# Patient Record
Sex: Male | Born: 1955 | Race: White | Hispanic: No | Marital: Married | Smoking: Never smoker
Health system: Southern US, Community
[De-identification: ages and names within clinical notes are randomized; demographics above are authoritative.]

## PROBLEM LIST (undated history)

## (undated) DIAGNOSIS — Z87898 Personal history of other specified conditions: Secondary | ICD-10-CM

## (undated) DIAGNOSIS — J45909 Unspecified asthma, uncomplicated: Secondary | ICD-10-CM

## (undated) DIAGNOSIS — E669 Obesity, unspecified: Secondary | ICD-10-CM

## (undated) DIAGNOSIS — Z91018 Allergy to other foods: Secondary | ICD-10-CM

## (undated) HISTORY — DX: Obesity, unspecified: E66.9

## (undated) HISTORY — PX: KNEE ARTHROSCOPY: SUR90

## (undated) HISTORY — PX: KNEE SURGERY: SHX244

## (undated) HISTORY — DX: Unspecified asthma, uncomplicated: J45.909

## (undated) HISTORY — DX: Allergy to other foods: Z91.018

## (undated) HISTORY — PX: TONSILLECTOMY: SUR1361

## (undated) HISTORY — DX: Personal history of other specified conditions: Z87.898

---

## 2002-08-19 DIAGNOSIS — H7090 Unspecified mastoiditis, unspecified ear: Secondary | ICD-10-CM

## 2002-08-19 HISTORY — DX: Unspecified mastoiditis, unspecified ear: H70.90

## 2003-10-17 ENCOUNTER — Inpatient Hospital Stay (HOSPITAL_COMMUNITY): Admission: RE | Admit: 2003-10-17 | Discharge: 2003-10-20 | Payer: Self-pay | Admitting: Otolaryngology

## 2009-11-02 ENCOUNTER — Encounter: Admission: RE | Admit: 2009-11-02 | Discharge: 2009-11-02 | Payer: Self-pay | Admitting: Family Medicine

## 2011-01-04 NOTE — Discharge Summary (Signed)
NAME:  Victor Best, Victor Best                           ACCOUNT NO.:  [id number]   MEDICAL RECORD NO.:  [id number]                   PATIENT TYPE:  INP   LOCATION:  5733                                 FACILITY:  [facility]   PHYSICIAN:  Kinnie Scales. Annalee Genta, M.D.            DATE OF BIRTH:  February 19, 1956   DATE OF ADMISSION:  DATE OF DISCHARGE:  10/20/2003                                 DISCHARGE SUMMARY   SERVICE:  Ear, nose and throat.   ADMISSION DIAGNOSIS:  Acute right mastoiditis with facial nerve dysfunction.   DISCHARGE DIAGNOSIS:  Acute right mastoiditis with facial nerve dysfunction.   SURGICAL PROCEDURES:  Right myringotomy and tube placement, October 17, 2003.   DISCHARGE INSTRUCTIONS:  The patient is discharged to home in stable  condition in the company of his wife.   DISCHARGE MEDICATIONS:  1. Continue to use previously prescribed Levaquin 500 mg p.o. daily.  2. Topical ear drops, ____ four drops in the right ear three times a day.   ADDITIONAL DISCHARGE MEDICATIONS:  1. Medrol Dosepak to be taken as directed.  2. Lacri-Lube ointment applied to the right eye at night.  3. Pain management as previously prescribed.   DISCHARGE ACTIVITIES:  No limitations.   DISCHARGE DIET:  No limitations.   SPECIAL INSTRUCTIONS:  1. The patient is to protect the right eye with glasses during the day,     avoiding trauma.  2. He will use Lacri-Lube ointment at night and tape to close the right     eyelid.  3. He is to keep the right ear dry while showering.  4. He will call for a followup appointment next week in my office at [phone] or sooner if needed.   BRIEF HISTORY:  The patient is a 55 year old white male who was seen on an  emergency basis on October 17, 2003, with a three-week history of acute  respiratory tract infection, one week history of severe otalgia and right-  sided otorrhea.  On the morning of October 16, 2003, the patient awakened  to find right facial paralysis  and contacted our office the morning of  October 17, 2003, and was evaluated on an emergency basis.  He was found to  have a middle-ear effusion, tenderness over the mastoid, a low-grade fever,  and grade 5 right facial paresis.  Given these findings, he was admitted to  [facility] on an emergency basis.  The CT scan of the temporal bone  was obtained, and he was scheduled for evaluation in the operating room with  a right myringotomy and tube placement.   The patient was admitted to the ENT service on October 17, 2003, under Dr.  Thurmon Fair care.  A CT scan was obtained at [facility] which  showed partial opacification of the right mastoid with middle ear effusion.  There was no evidence of bone erosions,  coalescence or chronic mastoiditis.  No evidence of bony dehiscence or other abnormality was noted.  The patient  was admitted and started on intravenous Zosyn 3.375 grams IV q.6h.  After  his first dose of Zosyn, the patient was also started on Decadron 10 mg IV  every eight hours.  These medications were continue throughout the patient's  hospitalization.  On the evening of October 17, 2003, the patient was taken  to the operating room at [facility] for general anesthesia and  underwent right myringotomy and tube placement.  Culture and sensitivity  from the middle ear effusion was then obtained, and an  Armstrong tympanostomy tube was placed in the tympanic membrane and ____  drops were instilled.  Culture was negative at two days with no evidence of  bacterial growth.  The patient was taken from the operating room to recovery  team [id number] for postoperative monitoring.  He continued on his intravenous  antibiotic therapy and steroids.  A period of observation was undertaken.   On the morning of October 18, 2003, the patient continued to have mild  discomfort, otorrhea and continued facial nerve dysfunction.  He was then  scheduled for possible mastoidectomy  under general anesthesia on October 19, 2003.  On the morning of October 19, 2003, the patient had some improvement in  his facial nerve paralysis.  It was a grade 3 paresis at that time with  partial lid closure and forehead movement which was significant improvement  from the previous examination. He continued to have otorrhea, but the fever  and otalgia had resolved.  Given these findings,  I felt comfortable  continuing observational and medical management, and the surgical procedure  was cancelled.   The following morning, he was reevaluated.  He continued to have resolution  of pain and had minimal otorrhea on the above medications.  Facial nerve  dysfunction had not significantly improved at that point, but the patient  was clinically improved, and he was discharged to home in stable condition  in the company of his wife, with the above discharge instructions.   The patient is to contact my office with worsening symptoms, and will follow  up in one week for reevaluation.  We expect a gradual improvement in facial  nerve dysfunction over time, but we are not able to guarantee complete  recovery. The patient and his wife are comfortable with their hospital care  and discharge instructions, and will follow up with me as above, or sooner  if warranted.                                                Kinnie Scales. Annalee Genta, M.D.    DLS/MEDQ  D:  16/05/9603  T:  10/21/2003  Job:  [id number]   cc:   [facility]  [place], [place]

## 2011-01-04 NOTE — Op Note (Signed)
NAME:  Victor Best, Victor Best NO.:  [id number]   MEDICAL RECORD NO.:  [id number]                   PATIENT TYPE:  OIB   LOCATION:  2873                                 FACILITY:  [facility]   PHYSICIAN:  Kinnie Scales. Annalee Genta, M.D.            DATE OF BIRTH:  [birth year]   DATE OF PROCEDURE:  10/17/2003  DATE OF DISCHARGE:                                 OPERATIVE REPORT   PREOPERATIVE DIAGNOSIS:  1. Right acute mastoiditis with middle ear effusion.  2. Acute right facial nerve paralysis.   POSTOPERATIVE DIAGNOSIS:  1. Right acute mastoiditis with middle ear effusion.  2. Acute right facial nerve paralysis.   OPERATION PERFORMED:  Right myringotomy and tube placement.   SURGEON:  Kinnie Scales. Annalee Genta, M.D.   ANESTHESIA:  General endotracheal.   COMPLICATIONS:  None.   FINDINGS:  Clear middle ear effusion, culture and sensitivity sent.   INDICATIONS FOR PROCEDURE:  Victor Best is a 55 year old white male who  presents today in my office for evaluation of acute right visual paresis and  a one week history of right-sided otalgia and otorrhea.  The patient had no  prior otologic history, no prior ear infections or prior otologic surgery.  He and his wife reported a two to three week history of acute upper  respiratory symptoms, nasal congestion and low grade sinus complaints.  Over  the last week, he has had progressive right-sided otalgia with right-sided  clear otorrhea.  He was seen by his primary care physician and was placed on  Zithromax one week ago.  Follow-up on Friday showed continued symptoms of  discharge and complaints of pain and he was placed on pain medications,  Levaquin which he failed to start, and Ciprodex drops.  The patient reported  heavy otorrhea and continued severe right-sided otalgia.  On the morning of  October 16, 2003 he awakened with right-sided facial paresis and presents  in our office for emergency evaluation.  Examination revealed  intact  tympanic membrane with right-sided middle ear effusion. He had a mixed  hearing loss in the right ear and a dense facial paralysis.  Given the  patient's history, examination and findings, he was admitted to [facility] for emergency intravenous antibiotic therapy, CT scan of the  temporal bones and mastoid and right myringotomy and tube placement.  The  risks, benefits and possible complications of the surgical procedure were  discussed in detail with the patient's wife and they understood and  concurred with our plan for surgery which was scheduled on an emergent basis  on October 17, 2003.   DESCRIPTION OF PROCEDURE:  The patient was brought to the operating room on  October 17, 2003 and placed in supine position on the operating table.  General endotracheal anesthesia was established without difficulty.  When  the patient was adequately anesthetized, his ear was examined.  A small  amount of  ceruminous debris was removed.  The ear canal was intact and the  middle ear space appeared to be filled with fluid with moderate erythema of  the TM and a small amount of granulation tissue in the posterior tympanic  membrane.  No active otorrhea was noted.  An inferior quadrant myringotomy  was performed and a moderate amount of a clear effusion was aspirated from  the middle ear space.  A Sheehy tympanostomy tube was placed without  difficulty and Ciprodex drops were instilled within the ear canal.  The  patient was then awakened from his anesthetic.  He was extubated and was  transferred from the operating room to the recovery room in stable  condition.  There were no complications.  The estimated blood loss none.                                               Kinnie Scales. Annalee Genta, M.D.    DLS/MEDQ  D:  16/05/9603  T:  10/18/2003  Job:  [id number]

## 2012-05-25 ENCOUNTER — Encounter: Payer: Self-pay | Admitting: Internal Medicine

## 2012-06-11 ENCOUNTER — Encounter: Payer: Self-pay | Admitting: Internal Medicine

## 2012-07-14 ENCOUNTER — Ambulatory Visit (AMBULATORY_SURGERY_CENTER): Payer: Managed Care, Other (non HMO)

## 2012-07-14 VITALS — Ht 69.5 in | Wt 211.8 lb

## 2012-07-14 DIAGNOSIS — Z800 Family history of malignant neoplasm of digestive organs: Secondary | ICD-10-CM

## 2012-07-14 DIAGNOSIS — Z1211 Encounter for screening for malignant neoplasm of colon: Secondary | ICD-10-CM

## 2012-07-14 MED ORDER — MOVIPREP 100 G PO SOLR: ORAL | Status: DC

## 2012-07-15 ENCOUNTER — Encounter: Payer: Self-pay | Admitting: Internal Medicine

## 2012-07-27 ENCOUNTER — Encounter: Payer: Self-pay | Admitting: Internal Medicine

## 2013-06-16 ENCOUNTER — Telehealth: Payer: Self-pay | Admitting: Family Medicine

## 2013-06-16 NOTE — Telephone Encounter (Signed)
Patient was seen by urgent care in [place] yesterday for arm and chest pain. Dx with an orthopedic problem but wife wasn't exactly sure what the diagnosis was. He needs to be seen by a Careers adviser. Will not be in town until 11/21 and would like to see Dr. Modesto Charon. Dr. Modesto Charon will be out of town the last two weeks in November. Patient does not want to have surgery in [place]. He may fly home earlier. If so his wife will call back for an appointment then.

## 2013-06-29 ENCOUNTER — Telehealth: Payer: Self-pay

## 2013-06-29 NOTE — Telephone Encounter (Signed)
Pt wife called to report that he is in [place] and went to urgent care there for chest /back pain and was treated with some prednisone Requesting appt with dr Modesto Charon --aware dr Modesto Charon out of office til nov 24 but could be seen with someone else.  Wife states he has appt with bill on nov 21 .  Wife strongly advised if pt having chest pain in [place] he needs to seek emergency medical treatment . Wife verbalized understanding

## 2013-06-30 ENCOUNTER — Ambulatory Visit: Payer: Self-pay | Admitting: Family Medicine

## 2013-07-05 ENCOUNTER — Ambulatory Visit: Payer: Self-pay | Admitting: Family Medicine

## 2013-07-09 ENCOUNTER — Encounter (INDEPENDENT_AMBULATORY_CARE_PROVIDER_SITE_OTHER): Payer: Self-pay

## 2013-07-09 ENCOUNTER — Ambulatory Visit (INDEPENDENT_AMBULATORY_CARE_PROVIDER_SITE_OTHER): Payer: Self-pay | Admitting: Family Medicine

## 2013-07-09 ENCOUNTER — Ambulatory Visit: Payer: Self-pay | Admitting: Family Medicine

## 2013-07-09 ENCOUNTER — Encounter: Payer: Self-pay | Admitting: Family Medicine

## 2013-07-09 VITALS — BP 134/86 | HR 71 | Temp 98.7°F

## 2013-07-09 DIAGNOSIS — J209 Acute bronchitis, unspecified: Secondary | ICD-10-CM

## 2013-07-09 DIAGNOSIS — J45909 Unspecified asthma, uncomplicated: Secondary | ICD-10-CM

## 2013-07-09 MED ORDER — PREDNISONE 10 MG PO TABS: ORAL_TABLET | ORAL | Status: DC

## 2013-07-09 MED ORDER — AMOXICILLIN 875 MG PO TABS: 875.0000 mg | ORAL_TABLET | Freq: Two times a day (BID) | ORAL | Status: DC

## 2013-07-09 NOTE — Progress Notes (Signed)
  Subjective:    Patient ID: Victor Best, male    DOB: January 18, 1956, 57 y.o.   MRN: [id number]  HPI This 58 y.o. male presents for evaluation of follow up from ED visit in [place]. He was seen in the ED for chest pain and back pain and was tx'd for pleurisy. He was rx'd prednisone and flexeril.   Review of Systems No chest pain, SOB, HA, dizziness, vision change, N/V, diarrhea, constipation, dysuria, urinary urgency or frequency, myalgias, arthralgias or rash.     Objective:   Physical Exam Vital signs noted  Well developed well nourished male.  HEENT - Head atraumatic Normocephalic                Eyes - PERRLA, Conjuctiva - clear Sclera- Clear EOMI                Ears - EAC's Wnl TM's Wnl Gross Hearing WNL                Nose - Nares patent                 Throat - oropharanx wnl Respiratory - Lungs CTA bilateral Cardiac - RRR S1 and S2 without murmur GI - Abdomen soft Nontender and bowel sounds active x 4 Extremities - No edema. Neuro - Grossly intact.       Assessment & Plan:  Acute bronchitis - Plan: amoxicillin (AMOXIL) 875 MG tablet, predniSONE (DELTASONE) 10 MG tablet.  Instructed paitent to do the prednisone taper and amoxicillin and also use short acting  Bronchodilater.  Asthma - Symbicort 160/4.5 2 puffs bid #4 samples  Deatra Canter FNP

## 2013-09-09 ENCOUNTER — Ambulatory Visit (INDEPENDENT_AMBULATORY_CARE_PROVIDER_SITE_OTHER): Payer: BC Managed Care – PPO | Admitting: Family Medicine

## 2013-09-09 ENCOUNTER — Encounter: Payer: Self-pay | Admitting: Family Medicine

## 2013-09-09 VITALS — BP 127/82 | HR 68 | Temp 98.7°F | Ht 69.0 in | Wt 220.4 lb

## 2013-09-09 DIAGNOSIS — K3189 Other diseases of stomach and duodenum: Secondary | ICD-10-CM

## 2013-09-09 DIAGNOSIS — R1013 Epigastric pain: Secondary | ICD-10-CM

## 2013-09-09 DIAGNOSIS — J45901 Unspecified asthma with (acute) exacerbation: Secondary | ICD-10-CM

## 2013-09-09 MED ORDER — METHYLPREDNISOLONE ACETATE 80 MG/ML IJ SUSP
80.0000 mg | Freq: Once | INTRAMUSCULAR | Status: AC
  Administered 2013-09-09: 80 mg via INTRAMUSCULAR

## 2013-09-09 MED ORDER — BECLOMETHASONE DIPROPIONATE 40 MCG/ACT IN AERS: 2.0000 | INHALATION_SPRAY | Freq: Two times a day (BID) | RESPIRATORY_TRACT | Status: DC

## 2013-09-09 MED ORDER — ALBUTEROL SULFATE HFA 108 (90 BASE) MCG/ACT IN AERS: 2.0000 | INHALATION_SPRAY | Freq: Four times a day (QID) | RESPIRATORY_TRACT | Status: DC | PRN

## 2013-09-09 MED ORDER — HYDROCODONE-HOMATROPINE 5-1.5 MG/5ML PO SYRP: 5.0000 mL | ORAL_SOLUTION | Freq: Three times a day (TID) | ORAL | Status: DC | PRN

## 2013-09-09 MED ORDER — MONTELUKAST SODIUM 10 MG PO TABS: 10.0000 mg | ORAL_TABLET | Freq: Once | ORAL | Status: DC

## 2013-09-09 MED ORDER — PANTOPRAZOLE SODIUM 40 MG PO TBEC: 40.0000 mg | DELAYED_RELEASE_TABLET | Freq: Every day | ORAL | Status: DC

## 2013-09-09 NOTE — Patient Instructions (Addendum)
Methylprednisolone Suspension for Injection What is this medicine? METHYLPREDNISOLONE (meth ill pred NISS oh lone) is a corticosteroid. It is commonly used to treat inflammation of the skin, joints, lungs, and other organs. Common conditions treated include asthma, allergies, and arthritis. It is also used for other conditions, such as blood disorders and diseases of the adrenal glands. This medicine may be used for other purposes; ask your health care provider or pharmacist if you have questions. COMMON BRAND NAME(S): Depmedalone-40, Depmedalone-80 , Depo-Medrol What should I tell my health care provider before I take this medicine? They need to know if you have any of these conditions: -cataracts or glaucoma -Cushings -heart disease -high blood pressure -infection including tuberculosis -low calcium or potassium levels in the blood -recent surgery -seizures -stomach or intestinal disease, including colitis -threadworms -thyroid problems -an unusual or allergic reaction to methylprednisolone, corticosteroids, benzyl alcohol, other medicines, foods, dyes, or preservatives -pregnant or trying to get pregnant -breast-feeding How should I use this medicine? This medicine is for injection into a muscle, joint, or other tissue. It is given by a health care professional in a hospital or clinic setting. Talk to your pediatrician regarding the use of this medicine in children. While this drug may be prescribed for selected conditions, precautions do apply. Overdosage: If you think you have taken too much of this medicine contact a poison control center or emergency room at once. NOTE: This medicine is only for you. Do not share this medicine with others. What if I miss a dose? This does not apply. What may interact with this medicine? Do not take this medicine with any of the following medications: -mifepristone This medicine may also interact with the following medications: -aspirin and  aspirin-like medicines -cyclosporin -ketoconazole -phenobarbital -phenytoin -rifampin -tacrolimus -troleandomycin -vaccines -warfarin This list may not describe all possible interactions. Give your health care provider a list of all the medicines, herbs, non-prescription drugs, or dietary supplements you use. Also tell them if you smoke, drink alcohol, or use illegal drugs. Some items may interact with your medicine. What should I watch for while using this medicine? Visit your doctor or health care professional for regular checks on your progress. If you are taking this medicine for a long time, carry an identification card with your name and address, the type and dose of your medicine, and your doctor's name and address. The medicine may increase your risk of getting an infection. Stay away from people who are sick. Tell your doctor or health care professional if you are around anyone with measles or chickenpox. You may need to avoid some vaccines. Talk to your health care provider for more information. If you are going to have surgery, tell your doctor or health care professional that you have taken this medicine within the last twelve months. Ask your doctor or health care professional about your diet. You may need to lower the amount of salt you eat. The medicine can increase your blood sugar. If you are a diabetic check with your doctor if you need help adjusting the dose of your diabetic medicine. What side effects may I notice from receiving this medicine? Side effects that you should report to your doctor or health care professional as soon as possible: -allergic reactions like skin rash, itching or hives, swelling of the face, lips, or tongue -bloody or tarry stools -changes in vision -eye pain or bulging eyes -fever, sore throat, sneezing, cough, or other signs of infection, wounds that will not heal -increased thirst -irregular  heartbeat -muscle cramps -pain in hips, back,  ribs, arms, shoulders, or legs -swelling of the ankles, feet, hands -trouble passing urine or change in the amount of urine -unusual bleeding or bruising -unusually weak or tired -weight gain or weight loss Side effects that usually do not require medical attention (report to your doctor or health care professional if they continue or are bothersome): -changes in emotions or moods -constipation or diarrhea -headache -irritation at site where injected -nausea, vomiting -skin problems, acne, thin and shiny skin -trouble sleeping -unusual hair growth on the face or body This list may not describe all possible side effects. Call your doctor for medical advice about side effects. You may report side effects to FDA at 1-800-FDA-1088. Where should I keep my medicine? This drug is given in a hospital or clinic and will not be stored at home. NOTE: This sheet is a summary. It may not cover all possible information. If you have questions about this medicine, talk to your doctor, pharmacist, or health care provider.  2014, [employer]. (2012-05-05 11:37:56)   Asthma, Adult Asthma is a recurring condition in which the airways tighten and narrow. Asthma can make it difficult to breathe. It can cause coughing, wheezing, and shortness of breath. Asthma episodes (also called asthma attacks) range from minor to life-threatening. Asthma cannot be cured, but medicines and lifestyle changes can help control it. CAUSES Asthma is believed to be caused by inherited (genetic) and environmental factors, but its exact cause is unknown. Asthma may be triggered by allergens, lung infections, or irritants in the air. Asthma triggers are different for each person. Common triggers include:   Animal dander.  Dust mites.  Cockroaches.  Pollen from trees or grass.  Mold.  Smoke.  Air pollutants such as dust, household cleaners, hair sprays, aerosol sprays, paint fumes, strong chemicals, or strong  odors.  Cold air, weather changes, and winds (which increase molds and pollens in the air).  Strong emotional expressions such as crying or laughing hard.  Stress.  Certain medicines (such as aspirin) or types of drugs (such as beta-blockers).  Sulfites in foods and drinks. Foods and drinks that may contain sulfites include dried fruit, potato chips, and sparkling grape juice.  Infections or inflammatory conditions such as the flu, a cold, or an inflammation of the nasal membranes (rhinitis).  Gastroesophageal reflux disease (GERD).  Exercise or strenuous activity. SYMPTOMS Symptoms may occur immediately after asthma is triggered or many hours later. Symptoms include:  Wheezing.  Excessive nighttime or early morning coughing.  Frequent or severe coughing with a common cold.  Chest tightness.  Shortness of breath. DIAGNOSIS  The diagnosis of asthma is made by a review of your medical history and a physical exam. Tests may also be performed. These may include:  Lung function studies. These tests show how much air you breath in and out.  Allergy tests.  Imaging tests such as X-rays. TREATMENT  Asthma cannot be cured, but it can usually be controlled. Treatment involves identifying and avoiding your asthma triggers. It also involves medicines. There are 2 classes of medicine used for asthma treatment:   Controller medicines. These prevent asthma symptoms from occurring. They are usually taken every day.  Reliever or rescue medicines. These quickly relieve asthma symptoms. They are used as needed and provide short-term relief. Your health care provider will help you create an asthma action plan. An asthma action plan is a written plan for managing and treating your asthma attacks. It includes a  list of your asthma triggers and how they may be avoided. It also includes information on when medicines should be taken and when their dosage should be changed. An action plan may also  involve the use of a device called a peak flow meter. A peak flow meter measures how well the lungs are working. It helps you monitor your condition. HOME CARE INSTRUCTIONS   Take medicine as directed by your health care provider. Speak with your health care provider if you have questions about how or when to take the medicines.  Use a peak flow meter as directed by your health care provider. Record and keep track of readings.  Understand and use the action plan to help minimize or stop an asthma attack without needing to seek medical care.  Control your home environment in the following ways to help prevent asthma attacks:  Do not smoke. Avoid being exposed to secondhand smoke.  Change your heating and air conditioning filter regularly.  Limit your use of fireplaces and wood stoves.  Get rid of pests (such as roaches and mice) and their droppings.  Throw away plants if you see mold on them.  Clean your floors and dust regularly. Use unscented cleaning products.  Try to have someone else vacuum for you regularly. Stay out of rooms while they are being vacuumed and for a short while afterward. If you vacuum, use a dust mask from a hardware store, a double-layered or microfilter vacuum cleaner bag, or a vacuum cleaner with a HEPA filter.  Replace carpet with wood, tile, or vinyl flooring. Carpet can trap dander and dust.  Use allergy-proof pillows, mattress covers, and box spring covers.  Wash bed sheets and blankets every week in hot water and dry them in a dryer.  Use blankets that are made of polyester or cotton.  Clean bathrooms and kitchens with bleach. If possible, have someone repaint the walls in these rooms with mold-resistant paint. Keep out of the rooms that are being cleaned and painted.  Wash hands frequently. SEEK MEDICAL CARE IF:   You have wheezing, shortness of breath, or a cough even if taking medicine to prevent attacks.  The colored mucus you cough up  (sputum) is thicker than usual.  Your sputum changes from clear or white to yellow, green, gray, or bloody.  You have any problems that may be related to the medicines you are taking (such as a rash, itching, swelling, or trouble breathing).  You are using a reliever medicine more than 2 3 times per week.  Your peak flow is still at 50 79% of you personal best after following your action plan for 1 hour. SEEK IMMEDIATE MEDICAL CARE IF:   You seem to be getting worse and are unresponsive to treatment during an asthma attack.  You are short of breath even at rest.  You get short of breath when doing very little physical activity.  You have difficulty eating, drinking, or talking due to asthma symptoms.  You develop chest pain.  You develop a fast heartbeat.  You have a bluish color to your lips or fingernails.  You are lightheaded, dizzy, or faint.  Your peak flow is less than 50% of your personal best.  You have a fever or persistent symptoms for more than 2 3 days.  You have a fever and symptoms suddenly get worse. MAKE SURE YOU:   Understand these instructions.  Will watch your condition.  Will get help right away if you are not doing  well or get worse. Document Released: 08/05/2005 Document Revised: 04/07/2013 Document Reviewed: 03/04/2013 [facility] Patient Information 2014 [place], [place].   Gastroesophageal Reflux Disease, Adult Gastroesophageal reflux disease (GERD) happens when acid from your stomach flows up into the esophagus. When acid comes in contact with the esophagus, the acid causes soreness (inflammation) in the esophagus. Over time, GERD may create small holes (ulcers) in the lining of the esophagus. CAUSES   Increased body weight. This puts pressure on the stomach, making acid rise from the stomach into the esophagus.  Smoking. This increases acid production in the stomach.  Drinking alcohol. This causes decreased pressure in the lower esophageal  sphincter (valve or ring of muscle between the esophagus and stomach), allowing acid from the stomach into the esophagus.  Late evening meals and a full stomach. This increases pressure and acid production in the stomach.  A malformed lower esophageal sphincter. Sometimes, no cause is found. SYMPTOMS   Burning pain in the lower part of the mid-chest behind the breastbone and in the mid-stomach area. This may occur twice a week or more often.  Trouble swallowing.  Sore throat.  Dry cough.  Asthma-like symptoms including chest tightness, shortness of breath, or wheezing. DIAGNOSIS  Your caregiver may be able to diagnose GERD based on your symptoms. In some cases, X-rays and other tests may be done to check for complications or to check the condition of your stomach and esophagus. TREATMENT  Your caregiver may recommend over-the-counter or prescription medicines to help decrease acid production. Ask your caregiver before starting or adding any new medicines.  HOME CARE INSTRUCTIONS   Change the factors that you can control. Ask your caregiver for guidance concerning weight loss, quitting smoking, and alcohol consumption.  Avoid foods and drinks that make your symptoms worse, such as:  Caffeine or alcoholic drinks.  Chocolate.  Peppermint or mint flavorings.  Garlic and onions.  Spicy foods.  Citrus fruits, such as oranges, lemons, or limes.  Tomato-based foods such as sauce, chili, salsa, and pizza.  Fried and fatty foods.  Avoid lying down for the 3 hours prior to your bedtime or prior to taking a nap.  Eat small, frequent meals instead of large meals.  Wear loose-fitting clothing. Do not wear anything tight around your waist that causes pressure on your stomach.  Raise the head of your bed 6 to 8 inches with wood blocks to help you sleep. Extra pillows will not help.  Only take over-the-counter or prescription medicines for pain, discomfort, or fever as directed by  your caregiver.  Do not take aspirin, ibuprofen, or other nonsteroidal anti-inflammatory drugs (NSAIDs). SEEK IMMEDIATE MEDICAL CARE IF:   You have pain in your arms, neck, jaw, teeth, or back.  Your pain increases or changes in intensity or duration.  You develop nausea, vomiting, or sweating (diaphoresis).  You develop shortness of breath, or you faint.  Your vomit is green, yellow, black, or looks like coffee grounds or blood.  Your stool is red, bloody, or black. These symptoms could be signs of other problems, such as heart disease, gastric bleeding, or esophageal bleeding. MAKE SURE YOU:   Understand these instructions.  Will watch your condition.  Will get help right away if you are not doing well or get worse. Document Released: 05/15/2005 Document Revised: 10/28/2011 Document Reviewed: 02/22/2011 [facility] Patient Information 2014 [place], [place].

## 2013-09-09 NOTE — Progress Notes (Signed)
Tolerated depo medrol IM without difficulty  

## 2013-09-09 NOTE — Progress Notes (Signed)
Patient ID: Victor Best, male   DOB: November 14, 1955, 58 y.o.   MRN: [id number] SUBJECTIVE: CC: Chief Complaint  Patient presents with  . Follow-up    reck bronchitis  states the brochiits has come back states his albutterol and symbicort not helping    HPI: Changing cooling tanks, and burning wood. Bronchitis Takes 2 puffs of albuterol at nights.and it doesn't seem to help/. Cough worse at nights. Denies GERD symptoms. Has had asthma  As a child and every winter he has a bout of bronchitis.  Past Medical History  Diagnosis Date  . Hx of wheezing     worse laying on back  . Asthma    Past Surgical History  Procedure Laterality Date  . Knee surgery      right  . Tonsillectomy     History   Social History  . Marital Status: Married    Spouse Name: N/A    Number of Children: N/A  . Years of Education: N/A   Occupational History  . Not on file.   Social History Main Topics  . Smoking status: Never Smoker   . Smokeless tobacco: Never Used  . Alcohol Use: No  . Drug Use: No  . Sexual Activity: Not on file   Other Topics Concern  . Not on file   Social History Narrative  . No narrative on file   Family History  Problem Relation Age of Onset  . Diabetes Father   . Colon cancer Paternal Uncle    Current Outpatient Prescriptions on File Prior to Visit  Medication Sig Dispense Refill  . Flaxseed, Linseed, (FLAXSEED OIL PO) Take by mouth daily.       No current facility-administered medications on file prior to visit.   No Known Allergies  There is no immunization history on file for this patient. Prior to Admission medications   Medication Sig Start Date End Date Taking? Authorizing Provider  amoxicillin (AMOXIL) 875 MG tablet Take 1 tablet (875 mg total) by mouth 2 (two) times daily. 07/09/13   Lysbeth Penner, FNP  cyclobenzaprine (FLEXERIL) 5 MG tablet Take 5 mg by mouth 3 (three) times daily as needed for muscle spasms.    Historical Provider, MD   dexamethasone (DECADRON) 4 MG tablet Take 4 mg by mouth 2 (two) times daily with a meal.    Historical Provider, MD  Flaxseed, Linseed, (FLAXSEED OIL PO) Take by mouth daily.    Historical Provider, MD  ibuprofen (ADVIL,MOTRIN) 800 MG tablet Take 800 mg by mouth every 8 (eight) hours as needed.    Historical Provider, MD  predniSONE (DELTASONE) 10 MG tablet 4 po qd x 2 days then 3 po qd x 2 days then 2po qd x 2days then one po qd x 2 days then stop 07/09/13   Lysbeth Penner, FNP  predniSONE (DELTASONE) 20 MG tablet Take 20 mg by mouth daily with breakfast.    Historical Provider, MD  traMADol (ULTRAM) 50 MG tablet Take by mouth every 6 (six) hours as needed.    Historical Provider, MD     ROS: As above in the HPI. All other systems are stable or negative.  OBJECTIVE: APPEARANCE:  Patient in no acute distress.The patient appeared well nourished and normally developed. Acyanotic. Waist: VITAL SIGNS:BP 127/82  Pulse 68  Temp(Src) 98.7 F (37.1 C) (Oral)  Ht 5\' 9"  (1.753 m)  Wt 220 lb 6.4 oz (99.973 kg)  BMI 32.53 kg/m2  SpO2 97%  PF 500 L/min  Obese WM  SKIN: warm and  Dry without overt rashes, tattoos and scars  HEAD and Neck: without JVD, Head and scalp: normal Eyes:No scleral icterus. Fundi normal, eye movements normal. Ears: Auricle normal, canal normal, Tympanic membranes normal, insufflation normal. Nose: normal Throat: wheeze across the larynx  Neck & thyroid: normal  CHEST & LUNGS: Chest wall:normal  LUNGS: coarse breath sounds; end expiratory wheeze. Good air flow  Peak flow 500.   CVS: Reveals the PMI to be normally located. Regular rhythm, First and Second Heart sounds are normal,  absence of murmurs, rubs or gallops. Peripheral vasculature: Radial pulses: normal Dorsal pedis pulses: normal Posterior pulses: normal  ABDOMEN:  Appearance: normal Benign, no organomegaly, no masses, no Abdominal Aortic enlargement. No Guarding , no rebound. No  Bruits. Bowel sounds: normal  RECTAL: N/A GU: N/A  EXTREMETIES: nonedematous.  MUSCULOSKELETAL:  Spine: normal Joints: intact  NEUROLOGIC: oriented to time,place and person; nonfocal. Strength is normal Sensory is normal Reflexes are normal Cranial Nerves are normal.  ASSESSMENT:  Asthma with acute exacerbation - Plan: HYDROcodone-homatropine (HYCODAN) 5-1.5 MG/5ML syrup, montelukast (SINGULAIR) 10 MG tablet, albuterol (PROVENTIL HFA;VENTOLIN HFA) 108 (90 BASE) MCG/ACT inhaler, beclomethasone (QVAR) 40 MCG/ACT inhaler, methylPREDNISolone acetate (DEPO-MEDROL) injection 80 mg  Dyspepsia - Plan: pantoprazole (PROTONIX) 40 MG tablet Suspect underlying GERD. Asymptomatic   PLAN:  Hand out on GERD and asthma in the AVS   No orders of the defined types were placed in this encounter.   Meds ordered this encounter  Medications  . DISCONTD: albuterol (PROVENTIL HFA;VENTOLIN HFA) 108 (90 BASE) MCG/ACT inhaler    Sig: Inhale 2 puffs into the lungs every 6 (six) hours as needed for wheezing or shortness of breath.  . DISCONTD: budesonide-formoterol (SYMBICORT) 160-4.5 MCG/ACT inhaler    Sig: Inhale 2 puffs into the lungs 2 (two) times daily.  . pantoprazole (PROTONIX) 40 MG tablet    Sig: Take 1 tablet (40 mg total) by mouth daily.    Dispense:  30 tablet    Refill:  3  . HYDROcodone-homatropine (HYCODAN) 5-1.5 MG/5ML syrup    Sig: Take 5 mLs by mouth every 8 (eight) hours as needed for cough.    Dispense:  120 mL    Refill:  0  . montelukast (SINGULAIR) 10 MG tablet    Sig: Take 1 tablet (10 mg total) by mouth once.    Dispense:  30 tablet    Refill:  3  . albuterol (PROVENTIL HFA;VENTOLIN HFA) 108 (90 BASE) MCG/ACT inhaler    Sig: Inhale 2 puffs into the lungs every 6 (six) hours as needed for wheezing or shortness of breath.    Dispense:  1 Inhaler    Refill:  2  . beclomethasone (QVAR) 40 MCG/ACT inhaler    Sig: Inhale 2 puffs into the lungs 2 (two) times daily.     Dispense:  1 Inhaler    Refill:  2  . methylPREDNISolone acetate (DEPO-MEDROL) injection 80 mg    Sig:    Medications Discontinued During This Encounter  Medication Reason  . amoxicillin (AMOXIL) 875 MG tablet Completed Course  . traMADol (ULTRAM) 50 MG tablet Completed Course  . ibuprofen (ADVIL,MOTRIN) 800 MG tablet Completed Course  . cyclobenzaprine (FLEXERIL) 5 MG tablet Completed Course  . dexamethasone (DECADRON) 4 MG tablet Completed Course  . predniSONE (DELTASONE) 10 MG tablet Completed Course  . predniSONE (DELTASONE) 20 MG tablet Completed Course  . budesonide-formoterol (SYMBICORT) 160-4.5 MCG/ACT inhaler Change in therapy  . albuterol (PROVENTIL  HFA;VENTOLIN HFA) 108 (90 BASE) MCG/ACT inhaler Reorder   Return in about 2 weeks (around 09/23/2013) for Recheck medical problems.  Claudio Mondry P. Jacelyn Grip, M.D.

## 2013-09-10 ENCOUNTER — Telehealth: Payer: Self-pay | Admitting: Family Medicine

## 2013-09-10 ENCOUNTER — Ambulatory Visit: Payer: Self-pay | Admitting: Family Medicine

## 2013-09-10 NOTE — Telephone Encounter (Signed)
Appt given for 1 week follow up per patients request

## 2013-09-16 ENCOUNTER — Ambulatory Visit (INDEPENDENT_AMBULATORY_CARE_PROVIDER_SITE_OTHER): Payer: BC Managed Care – PPO | Admitting: Family Medicine

## 2013-09-16 ENCOUNTER — Encounter: Payer: Self-pay | Admitting: Family Medicine

## 2013-09-16 ENCOUNTER — Ambulatory Visit (INDEPENDENT_AMBULATORY_CARE_PROVIDER_SITE_OTHER): Payer: BC Managed Care – PPO

## 2013-09-16 VITALS — BP 138/84 | HR 79 | Temp 98.6°F | Ht 69.0 in | Wt 215.6 lb

## 2013-09-16 DIAGNOSIS — E669 Obesity, unspecified: Secondary | ICD-10-CM | POA: Insufficient documentation

## 2013-09-16 DIAGNOSIS — R0989 Other specified symptoms and signs involving the circulatory and respiratory systems: Secondary | ICD-10-CM

## 2013-09-16 DIAGNOSIS — J45901 Unspecified asthma with (acute) exacerbation: Secondary | ICD-10-CM | POA: Insufficient documentation

## 2013-09-16 DIAGNOSIS — J45909 Unspecified asthma, uncomplicated: Secondary | ICD-10-CM | POA: Insufficient documentation

## 2013-09-16 MED ORDER — PREDNISONE 20 MG PO TABS: 60.0000 mg | ORAL_TABLET | Freq: Every day | ORAL | Status: DC

## 2013-09-16 MED ORDER — AMOXICILLIN 875 MG PO TABS: 875.0000 mg | ORAL_TABLET | Freq: Two times a day (BID) | ORAL | Status: DC

## 2013-09-16 MED ORDER — IPRATROPIUM-ALBUTEROL 0.5-2.5 (3) MG/3ML IN SOLN
3.0000 mL | Freq: Once | RESPIRATORY_TRACT | Status: AC
  Administered 2013-09-16: 3 mL via RESPIRATORY_TRACT

## 2013-09-16 MED ORDER — ROFLUMILAST 500 MCG PO TABS: 500.0000 ug | ORAL_TABLET | Freq: Every day | ORAL | Status: DC

## 2013-09-16 NOTE — Progress Notes (Signed)
[facility] NEB TREATMENT INITIATED . PT TOLERATED WELL

## 2013-09-16 NOTE — Progress Notes (Signed)
Patient ID: Victor Best, male   DOB: 06/23/1956, 58 y.o.   MRN: [id number] SUBJECTIVE: CC: Chief Complaint  Patient presents with  . Follow-up    1 week follow up asthma states "about same "    HPI: Asthma recheck. Has had persistent moderate asthma since last September. Traveled to [place] and was evaluated and treated in an ED. Came back and still has asthma. The albuterol has not made much difference. He has used the QVar and the singulair and initially was better. Then started to relapse again. He has elevated his bed he has changed his eating. He wheezes in paroxysms. No chest pain , no leg pains.   Past Medical History  Diagnosis Date  . Hx of wheezing     worse laying on back  . Asthma   . Obesity    Past Surgical History  Procedure Laterality Date  . Knee surgery      right  . Tonsillectomy     History   Social History  . Marital Status: Married    Spouse Name: N/A    Number of Children: N/A  . Years of Education: N/A   Occupational History  . Not on file.   Social History Main Topics  . Smoking status: Never Smoker   . Smokeless tobacco: Never Used  . Alcohol Use: No  . Drug Use: No  . Sexual Activity: Not on file   Other Topics Concern  . Not on file   Social History Narrative  . No narrative on file   Family History  Problem Relation Age of Onset  . Diabetes Father   . Colon cancer Paternal Uncle    Current Outpatient Prescriptions on File Prior to Visit  Medication Sig Dispense Refill  . beclomethasone (QVAR) 40 MCG/ACT inhaler Inhale 2 puffs into the lungs 2 (two) times daily.  1 Inhaler  2  . Flaxseed, Linseed, (FLAXSEED OIL PO) Take by mouth daily.      Marland Kitchen HYDROcodone-homatropine (HYCODAN) 5-1.5 MG/5ML syrup Take 5 mLs by mouth every 8 (eight) hours as needed for cough.  120 mL  0  . montelukast (SINGULAIR) 10 MG tablet Take 1 tablet (10 mg total) by mouth once.  30 tablet  3  . pantoprazole (PROTONIX) 40 MG tablet Take 1 tablet (40 mg  total) by mouth daily.  30 tablet  3  . albuterol (PROVENTIL HFA;VENTOLIN HFA) 108 (90 BASE) MCG/ACT inhaler Inhale 2 puffs into the lungs every 6 (six) hours as needed for wheezing or shortness of breath.  1 Inhaler  2   No current facility-administered medications on file prior to visit.   No Known Allergies  There is no immunization history on file for this patient. Prior to Admission medications   Medication Sig Start Date End Date Taking? Authorizing Provider  albuterol (PROVENTIL HFA;VENTOLIN HFA) 108 (90 BASE) MCG/ACT inhaler Inhale 2 puffs into the lungs every 6 (six) hours as needed for wheezing or shortness of breath. 09/09/13   Vernie Shanks, MD  beclomethasone (QVAR) 40 MCG/ACT inhaler Inhale 2 puffs into the lungs 2 (two) times daily. 09/09/13   Vernie Shanks, MD  Flaxseed, Linseed, (FLAXSEED OIL PO) Take by mouth daily.    Historical Provider, MD  HYDROcodone-homatropine (HYCODAN) 5-1.5 MG/5ML syrup Take 5 mLs by mouth every 8 (eight) hours as needed for cough. 09/09/13   Vernie Shanks, MD  montelukast (SINGULAIR) 10 MG tablet Take 1 tablet (10 mg total) by mouth once. 09/09/13  Vernie Shanks, MD  pantoprazole (PROTONIX) 40 MG tablet Take 1 tablet (40 mg total) by mouth daily. 09/09/13   Vernie Shanks, MD     ROS: As above in the HPI. All other systems are stable or negative.  OBJECTIVE: APPEARANCE:  Patient in no acute distress.The patient appeared well nourished and normally developed. Acyanotic. Waist: VITAL SIGNS:BP 138/84  Pulse 79  Temp(Src) 98.6 F (37 C) (Oral)  Ht 5\' 9"  (1.753 m)  Wt 215 lb 9.6 oz (97.796 kg)  BMI 31.82 kg/m2  SpO2 94%   SKIN: warm and  Dry without overt rashes, tattoos and scars  HEAD and Neck: without JVD, Head and scalp: normal Eyes:No scleral icterus. Fundi normal, eye movements normal. Ears: Auricle normal, canal normal, Tympanic membranes normal, insufflation normal. Nose: normal Throat: normal Neck & thyroid: normal  CHEST  & LUNGS: Chest wall: normal Lungs: bilateral rhonchi, wheezes and crackles. A unit dose of DUOneb was given via nebulizer treatment. With improvement of the wheezes and airflow. However thepulse oximetry was unchanged.  CVS: Reveals the PMI to be normally located. Regular rhythm, First and Second Heart sounds are normal,  absence of murmurs, rubs or gallops. Peripheral vasculature: Radial pulses: normal Dorsal pedis pulses: normal Posterior pulses: normal  ABDOMEN:  Appearance: normal Benign, no organomegaly, no masses, no Abdominal Aortic enlargement. No Guarding , no rebound. No Bruits. Bowel sounds: normal  RECTAL: N/A GU: N/A  EXTREMETIES: nonedematous.  MUSCULOSKELETAL:  Spine: normal Joints: intact  NEUROLOGIC: oriented to time,place and person; nonfocal. Strength is normal Sensory is normal Reflexes are normal Cranial Nerves are normal.  ASSESSMENT: Asthma with acute exacerbation - Plan: ipratropium-albuterol (DUONEB) 0.5-2.5 (3) MG/3ML nebulizer solution 3 mL, predniSONE (DELTASONE) 20 MG tablet, amoxicillin (AMOXIL) 875 MG tablet  Obesity  Chest congestion - Plan: EKG 12-Lead, DG Chest 2 View, predniSONE (DELTASONE) 20 MG tablet, amoxicillin (AMOXIL) 875 MG tablet  PLAN:  Orders Placed This Encounter  Procedures  . DG Chest 2 View    Standing Status: Future     Number of Occurrences: 1     Standing Expiration Date: 11/15/2014    Order Specific Question:  Reason for Exam (SYMPTOM  OR DIAGNOSIS REQUIRED)    Answer:  chest congestion    Order Specific Question:  Preferred imaging location?    Answer:  Internal  . EKG 12-Lead  EKG is normal                              Meds ordered this encounter  Medications  . ipratropium-albuterol (DUONEB) 0.5-2.5 (3) MG/3ML nebulizer solution 3 mL    Sig:   . predniSONE (DELTASONE) 20 MG tablet    Sig: Take 3 tablets (60 mg total) by mouth daily with breakfast. For 3 days, then 2 tabs daily for 2 days, then 1 tab  daily for 2 days then 1/2 tab daily for 2 days.    Dispense:  13 tablet    Refill:  0  . amoxicillin (AMOXIL) 875 MG tablet    Sig: Take 1 tablet (875 mg total) by mouth 2 (two) times daily.    Dispense:  20 tablet    Refill:  0  . roflumilast (DALIRESP) 500 MCG TABS tablet    Sig: Take 1 tablet (500 mcg total) by mouth daily.    Dispense:  14 tablet    Refill:  0  [facility] reading (PRIMARY) by  Dr. Jacelyn Grip: chronic  Changes. Bronchitis? Await official reading                                 There are no discontinued medications. Return in about 1 week (around 09/23/2013) for Recheck medical problems.  Keyron Pokorski P. Jacelyn Grip, M.D.

## 2013-09-16 NOTE — Patient Instructions (Signed)
Albuterol; Ipratropium solution for inhalation What is this medicine? ALBUTEROL; IPRATROPIUM (al BYOO ter ole; i pra TROE pee um) has two bronchodilators. It helps open up the airways in your lungs to make it easier to breathe. This medicine is used to treat chronic obstructive pulmonary disease (COPD). This medicine may be used for other purposes; ask your health care provider or pharmacist if you have questions. COMMON BRAND NAME(S): DuoNeb What should I tell my health care provider before I take this medicine? They need to know if you have any of the following conditions: -heart disease -high blood pressure -irregular heartbeat -an unusual or allergic reaction to albuterol, ipratropium, atropine, soya protein, soybeans or peanuts, other medicines, foods, dyes, or preservatives -pregnant or trying to get pregnant -breast-feeding How should I use this medicine? This medicine is used in a nebulizer. Nebulizers make a liquid into an aerosol that you breathe in through your mouth or your mouth and nose into your lungs. You will be taught how to use your nebulizer. Follow the directions on your prescription label. Take your medicine at regular intervals. Do not use more often than directed. Talk to your pediatrician regarding the use of this medicine in children. Special care may be needed. Overdosage: If you think you have taken too much of this medicine contact a poison control center or emergency room at once. NOTE: This medicine is only for you. Do not share this medicine with others. What if I miss a dose? If you miss a dose, use it as soon as you can. If it is almost time for your next dose, use only that dose. Do not use double or extra doses. What may interact with this medicine? Do not take this medicine with any of the following medications: -MAOIs like Carbex, Eldepryl, Marplan, Nardil, and Parnate This medicine may also interact with the following medications: -diuretics -medicines  for depression, anxiety, or psychotic disturbances -medicines for irregular heartbeat -medicines for weight loss including some herbal products -methadone -pimozide -some medicines for blood pressure or the heart -sertindole This list may not describe all possible interactions. Give your health care provider a list of all the medicines, herbs, non-prescription drugs, or dietary supplements you use. Also tell them if you smoke, drink alcohol, or use illegal drugs. Some items may interact with your medicine. What should I watch for while using this medicine? Tell your doctor or healthcare professional if your symptoms do not start to get better or if they get worse. If your breathing gets worse while you are using this medicine, call your doctor right away. Do not stop using your medicine unless your doctor tells you to. Your mouth may get dry. Chewing sugarless gum or sucking hard candy, and drinking plenty of water may help. Contact your doctor if the problem does not go away or is severe. You may get dizzy or have blurred vision. Do not drive, use machinery, or do anything that needs mental alertness until you know how this medicine affects you. Do not stand or sit up quickly, especially if you are an older patient. This reduces the risk of dizzy or fainting spells. What side effects may I notice from receiving this medicine? Side effects that you should report to your doctor or health care professional as soon as possible: -allergic reactions like skin rash, itching or hives, swelling of the face, lips, or tongue -breathing problems -feeling faint or lightheaded, falls -fever -high blood pressure -irregular heartbeat or chest pain -muscle cramps or weakness -  pain, tingling, numbness in the hands or feet -vomiting Side effects that usually do not require medical attention (report to your doctor or health care professional if they continue or are bothersome): -blurred  vision -cough -difficulty passing urine -difficulty sleeping -headache -nervousness or trembling -stuffy or runny nose -unusual taste -upset stomach This list may not describe all possible side effects. Call your doctor for medical advice about side effects. You may report side effects to FDA at 1-800-FDA-1088. Where should I keep my medicine? Keep out of the reach of children. Store at a room temperature 2 and 30 degees C (36 to 86 degrees F). Protect from light. Store this medicine in the protective pouch until ready to use. Throw away any unused medicine after the expiration date. NOTE: This sheet is a summary. It may not cover all possible information. If you have questions about this medicine, talk to your doctor, pharmacist, or health care provider.  2014, Elsevier/Gold Standard. (2010-04-03 15:40:09)

## 2013-09-23 ENCOUNTER — Ambulatory Visit (INDEPENDENT_AMBULATORY_CARE_PROVIDER_SITE_OTHER): Payer: BC Managed Care – PPO | Admitting: Family Medicine

## 2013-09-23 ENCOUNTER — Other Ambulatory Visit: Payer: Self-pay

## 2013-09-23 ENCOUNTER — Other Ambulatory Visit: Payer: Self-pay | Admitting: Family Medicine

## 2013-09-23 ENCOUNTER — Encounter: Payer: Self-pay | Admitting: Family Medicine

## 2013-09-23 ENCOUNTER — Ambulatory Visit (HOSPITAL_COMMUNITY)
Admission: RE | Admit: 2013-09-23 | Discharge: 2013-09-23 | Disposition: A | Discharge status: Home / Self Care | Payer: BC Managed Care – PPO | Source: Ambulatory Visit | Attending: Family Medicine | Admitting: Family Medicine

## 2013-09-23 VITALS — BP 140/85 | HR 64 | Temp 97.2°F | Ht 69.0 in | Wt 214.6 lb

## 2013-09-23 DIAGNOSIS — R062 Wheezing: Secondary | ICD-10-CM | POA: Insufficient documentation

## 2013-09-23 DIAGNOSIS — J45909 Unspecified asthma, uncomplicated: Secondary | ICD-10-CM

## 2013-09-23 DIAGNOSIS — E669 Obesity, unspecified: Secondary | ICD-10-CM

## 2013-09-23 DIAGNOSIS — R0600 Dyspnea, unspecified: Secondary | ICD-10-CM

## 2013-09-23 DIAGNOSIS — R05 Cough: Secondary | ICD-10-CM | POA: Insufficient documentation

## 2013-09-23 DIAGNOSIS — R0989 Other specified symptoms and signs involving the circulatory and respiratory systems: Secondary | ICD-10-CM

## 2013-09-23 DIAGNOSIS — R0689 Other abnormalities of breathing: Secondary | ICD-10-CM

## 2013-09-23 DIAGNOSIS — R059 Cough, unspecified: Secondary | ICD-10-CM | POA: Insufficient documentation

## 2013-09-23 DIAGNOSIS — R0609 Other forms of dyspnea: Secondary | ICD-10-CM | POA: Insufficient documentation

## 2013-09-23 DIAGNOSIS — J45901 Unspecified asthma with (acute) exacerbation: Secondary | ICD-10-CM

## 2013-09-23 MED ORDER — IOHEXOL 350 MG/ML SOLN
100.0000 mL | Freq: Once | INTRAVENOUS | Status: AC | PRN
  Administered 2013-09-23: 100 mL via INTRAVENOUS

## 2013-09-23 NOTE — Progress Notes (Signed)
Patient ID: Victor Best, male   DOB: 1955/11/04, 58 y.o.   MRN: [id number] SUBJECTIVE: CC: Chief Complaint  Patient presents with  . Follow-up    STATES NO BETTER    HPI: Here for follow up of assumed asthma exacerbation. No difference with he present regimen. The only thing that he feels that works somewhat is the [employer]. No effect with the rest. SOB , coughing and wheezing and the chest feels congested. 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  Past Medical History  Diagnosis Date  . Hx of wheezing     worse laying on back  . Asthma   . Obesity    Past Surgical History  Procedure Laterality Date  . Knee surgery      right  . Tonsillectomy     History   Social History  . Marital Status: Married    Spouse Name: N/A    Number of Children: N/A  . Years of Education: N/A   Occupational History  . Not on file.   Social History Main Topics  . Smoking status: Never Smoker   . Smokeless tobacco: Never Used  . Alcohol Use: No  . Drug Use: No  . Sexual Activity: Not on file   Other Topics Concern  . Not on file   Social History Narrative  . No narrative on file   Family History  Problem Relation Age of Onset  . Diabetes Father   . Colon cancer Paternal Uncle    Current Outpatient Prescriptions on File Prior to Visit  Medication Sig Dispense Refill  . albuterol (PROVENTIL HFA;VENTOLIN HFA) 108 (90 BASE) MCG/ACT inhaler Inhale 2 puffs into the lungs every 6 (six) hours as needed for wheezing or shortness of breath.  1 Inhaler  2  . amoxicillin (AMOXIL) 875 MG tablet Take 1 tablet (875 mg total) by mouth 2 (two) times daily.  20 tablet  0  . Flaxseed, Linseed, (FLAXSEED OIL PO) Take by mouth daily.      . montelukast (SINGULAIR) 10 MG tablet Take 1 tablet (10 mg total) by mouth once.  30 tablet  3  . pantoprazole (PROTONIX) 40 MG tablet Take 1 tablet (40 mg total) by mouth daily.  30 tablet  3  . roflumilast (DALIRESP) 500 MCG TABS tablet Take 1 tablet (500 mcg  total) by mouth daily.  14 tablet  0  . beclomethasone (QVAR) 40 MCG/ACT inhaler Inhale 2 puffs into the lungs 2 (two) times daily.  1 Inhaler  2  . HYDROcodone-homatropine (HYCODAN) 5-1.5 MG/5ML syrup Take 5 mLs by mouth every 8 (eight) hours as needed for cough.  120 mL  0  . predniSONE (DELTASONE) 20 MG tablet Take 3 tablets (60 mg total) by mouth daily with breakfast. For 3 days, then 2 tabs daily for 2 days, then 1 tab daily for 2 days then 1/2 tab daily for 2 days.  13 tablet  0   No current facility-administered medications on file prior to visit.   No Known Allergies  There is no immunization history on file for this patient. Prior to Admission medications   Medication Sig Start Date End Date Taking? Authorizing Provider  albuterol (PROVENTIL HFA;VENTOLIN HFA) 108 (90 BASE) MCG/ACT inhaler Inhale 2 puffs into the lungs every 6 (six) hours as needed for wheezing or shortness of breath. 09/09/13   Vernie Shanks, MD  amoxicillin (AMOXIL) 875 MG tablet Take 1 tablet (875 mg total) by mouth 2 (two) times daily. 09/16/13  Vernie Shanks, MD  beclomethasone (QVAR) 40 MCG/ACT inhaler Inhale 2 puffs into the lungs 2 (two) times daily. 09/09/13   Vernie Shanks, MD  Flaxseed, Linseed, (FLAXSEED OIL PO) Take by mouth daily.    Historical Provider, MD  HYDROcodone-homatropine (HYCODAN) 5-1.5 MG/5ML syrup Take 5 mLs by mouth every 8 (eight) hours as needed for cough. 09/09/13   Vernie Shanks, MD  montelukast (SINGULAIR) 10 MG tablet Take 1 tablet (10 mg total) by mouth once. 09/09/13   Vernie Shanks, MD  pantoprazole (PROTONIX) 40 MG tablet Take 1 tablet (40 mg total) by mouth daily. 09/09/13   Vernie Shanks, MD  predniSONE (DELTASONE) 20 MG tablet Take 3 tablets (60 mg total) by mouth daily with breakfast. For 3 days, then 2 tabs daily for 2 days, then 1 tab daily for 2 days then 1/2 tab daily for 2 days. 09/16/13   Vernie Shanks, MD  roflumilast (DALIRESP) 500 MCG TABS tablet Take 1 tablet (500 mcg  total) by mouth daily. 09/16/13   Vernie Shanks, MD     ROS: As above in the HPI. All other systems are stable or negative.  OBJECTIVE: APPEARANCE:  Patient in no acute distress.The patient appeared well nourished and normally developed. Acyanotic. Waist: VITAL SIGNS:BP 140/85  Pulse 64  Temp(Src) 97.2 F (36.2 C) (Oral)  Ht 5\' 9"  (1.753 m)  Wt 214 lb 9.6 oz (97.342 kg)  BMI 31.68 kg/m2  SpO2 93% WM obese Smells of engine oils and fuels.  SKIN: warm and  Dry without overt rashes, tattoos and scars  HEAD and Neck: without JVD, Head and scalp: normal Eyes:No scleral icterus. Fundi normal, eye movements normal. Ears: Auricle normal, canal normal, Tympanic membranes normal, insufflation normal. Nose: normal Throat: normal Neck & thyroid: normal  CHEST & LUNGS: Chest wall: normal Lungs: Coarse BS with bilateral rhonchi and crackles and wheezes.  CVS: Reveals the PMI to be normally located. Regular rhythm, First and Second Heart sounds are normal,  absence of murmurs, rubs or gallops. Peripheral vasculature: Radial pulses: normal Dorsal pedis pulses: normal Posterior pulses: normal  ABDOMEN:  Appearance: obese Benign, no organomegaly, no masses, no Abdominal Aortic enlargement. No Guarding , no rebound. No Bruits. Bowel sounds: normal  RECTAL: N/A GU: N/A  EXTREMETIES: nonedematous.  MUSCULOSKELETAL:  Spine: normal Joints: intact  NEUROLOGIC: oriented to time,place and person; nonfocal. Strength is normal Sensory is normal Reflexes are normal Cranial Nerves are normal.  ASSESSMENT: Obesity  Asthma with acute exacerbation  Dyspnea and respiratory abnormality - Plan: CT Angio Chest W/Cm &/Or Wo Cm, CANCELED: CT Chest W Contrast The low O2 sat is a concern especially as part of his job he travels frequently on flights to various  [place]. The last trip he was having asthma and he travelled to [place] and then he was worse there and treated in  [place].   PLAN: Concern if this has been a Pulmonary embolism. Will get a stat imaging study.  Orders Placed This Encounter  Procedures  . CT Angio Chest W/Cm &/Or Wo Cm    Standing Status: Future     Number of Occurrences:      Standing Expiration Date: 12/22/2014    Order Specific Question:  Reason for Exam (SYMPTOM  OR DIAGNOSIS REQUIRED)    Answer:  low O2 sat, dyspnea and coughing and wheezing    Order Specific Question:  Preferred imaging location?    Answer:  [facility]  continue medications  in meantime.  No orders of the defined types were placed in this encounter.   There are no discontinued medications. Return pending CT result.Dub Mikes P. Jacelyn Grip, M.D.

## 2013-09-23 NOTE — Progress Notes (Signed)
Quick Note:  Call patient.CT normal no PE. Continue present medications. Will refer to pulmonary for Evaluation. And treatment. ______

## 2013-09-27 ENCOUNTER — Telehealth: Payer: Self-pay | Admitting: Family Medicine

## 2013-09-27 NOTE — Telephone Encounter (Signed)
Pt's wife called to check on referral status. I gave her all of the info she would need to contact [facility]

## 2013-09-29 ENCOUNTER — Ambulatory Visit (INDEPENDENT_AMBULATORY_CARE_PROVIDER_SITE_OTHER): Payer: BC Managed Care – PPO | Admitting: Internal Medicine

## 2013-09-29 ENCOUNTER — Encounter: Payer: Self-pay | Admitting: Internal Medicine

## 2013-09-29 VITALS — BP 118/80 | HR 82 | Temp 98.0°F | Ht 70.0 in | Wt 214.6 lb

## 2013-09-29 DIAGNOSIS — R1013 Epigastric pain: Secondary | ICD-10-CM

## 2013-09-29 DIAGNOSIS — K3189 Other diseases of stomach and duodenum: Secondary | ICD-10-CM

## 2013-09-29 DIAGNOSIS — J45909 Unspecified asthma, uncomplicated: Secondary | ICD-10-CM

## 2013-09-29 DIAGNOSIS — R059 Cough, unspecified: Secondary | ICD-10-CM

## 2013-09-29 DIAGNOSIS — J45901 Unspecified asthma with (acute) exacerbation: Secondary | ICD-10-CM

## 2013-09-29 DIAGNOSIS — R05 Cough: Secondary | ICD-10-CM

## 2013-09-29 MED ORDER — PANTOPRAZOLE SODIUM 40 MG PO TBEC: DELAYED_RELEASE_TABLET | ORAL | Status: DC

## 2013-09-29 MED ORDER — MONTELUKAST SODIUM 10 MG PO TABS: 10.0000 mg | ORAL_TABLET | Freq: Once | ORAL | Status: DC

## 2013-09-29 MED ORDER — FAMOTIDINE 20 MG PO TABS: ORAL_TABLET | ORAL | Status: DC

## 2013-09-29 NOTE — Patient Instructions (Addendum)
Please see patient coordinator before you leave today  to schedule  [facility] CT   Pantoprazole (protonix) 40 mg   Take 30-60 min before first meal of the day and Pepcid ac 20 mg one bedtime until return to office - this is the best way to tell whether stomach acid is contributing to your problem.    Continue montelukast 10 mg in evening   GERD (REFLUX)  is an extremely common cause of respiratory symptoms, many times with no significant heartburn at all.    It can be treated with medication, but also with lifestyle changes including avoidance of late meals, excessive alcohol, smoking cessation, and avoid fatty foods, chocolate, peppermint, colas, red wine, and acidic juices such as orange juice.  NO MINT OR MENTHOL PRODUCTS SO NO COUGH DROPS  USE SUGARLESS CANDY INSTEAD (jolley ranchers or Stover's)  NO OIL BASED VITAMINS - use powdered substitutes.  Remember if you start wheezing to use the purse lip maneuver.  Please schedule a follow up office visit in 4 weeks, sooner if needed with pfts on return

## 2013-09-29 NOTE — Progress Notes (Signed)
Subjective:    Patient ID: Victor Best, male    DOB: 12/15/1955   MRN: [id number]  HPI  58 yowm never smoker grew out of asthma after given shots by age 58 and no symptoms with exercise in HS but somwhat limited and did not any meds then around 2011 started having problems with Oct onset of cough/ wheezing last until March and different [atterm onset sept 2014 eval by ER in [place] dx as pleurisy with improvement in pain but not wheezing which persisted despite even prednisone so referred 09/29/2013  by Jacelyn Grip to pulmonary clinic for refractory asthma.   09/29/2013 1st [facility] office visit/ Mirinda Monte  Chief Complaint  Patient presents with  . Pulmonary Consult    Referred per Dr. Yaakov Guthrie. Pt c/o cough and wheezing since Sept 2014.  Cough is prod with minimal green sputum.  He states that he "wheezing" occurs all of the time.    on singulair and protonix at hs, no response to saba. Cough worse day than night   No obvious day to day or daytime variabilty or assoc p or chest tightness,   overt sinus or hb symptoms. No unusual exp hx or h/o childhood pna/ asthma or knowledge of premature birth.  Sleeping ok without nocturnal  or early am exacerbation  of respiratory  c/o's or need for noct saba. Also denies any obvious fluctuation of symptoms with weather or environmental changes or other aggravating or alleviating factors except as outlined above   Current Medications, Allergies, Complete Past Medical History, Past Surgical History, Family History, and Social History were reviewed in [employer] record.            Review of Systems  Constitutional: Negative for fever, chills, activity change, appetite change and unexpected weight change.  HENT: Positive for postnasal drip. Negative for congestion, dental problem, rhinorrhea, sneezing, sore throat, trouble swallowing and voice change.   Eyes: Negative for visual disturbance.  Respiratory: Positive for cough.  Negative for choking and shortness of breath.   Cardiovascular: Negative for chest pain and leg swelling.  Gastrointestinal: Negative for nausea, vomiting and abdominal pain.  Genitourinary: Negative for difficulty urinating.  Musculoskeletal: Negative for arthralgias.  Skin: Negative for rash.  Psychiatric/Behavioral: Negative for behavioral problems and confusion.       Objective:   Physical Exam  Wt Readings from Last 3 Encounters:  09/29/13 214 lb 9.6 oz (97.342 kg)  09/23/13 214 lb 9.6 oz (97.342 kg)  09/16/13 215 lb 9.6 oz (97.796 kg)      amb wm nad with wheezing heard across the room   HEENT: nl dentition, turbinates, and orophanx. Nl external ear canals without cough reflex   NECK :  without JVD/Nodes/TM/ nl carotid upstrokes bilaterally   LUNGS: no acc muscle use, clear to A and P bilaterally without cough on insp or exp maneuvers   CV:  RRR  no s3 or murmur or increase in P2, no edema   ABD:  soft and nontender with nl excursion in the supine position. No bruits or organomegaly, bowel sounds nl  MS:  warm without deformities, calf tenderness, cyanosis or clubbing  SKIN: warm and dry without lesions    NEURO:  alert, approp, no deficits      09/24/13  CTa No CT evidence of focal or acute thoracic abnormalities.  Specifically there is no CT evidence of pulmonary arterial embolic  Disease.  CT sinus 09/30/2013 > Widespread paranasal sinus mucosal thickening, sparing  the frontal sinuses. No definite fluid level or bubbly opacity, but mild acute sinusitis might have this appearance.      Assessment & Plan:

## 2013-09-30 ENCOUNTER — Ambulatory Visit (INDEPENDENT_AMBULATORY_CARE_PROVIDER_SITE_OTHER)
Admission: RE | Admit: 2013-09-30 | Discharge: 2013-09-30 | Disposition: A | Discharge status: Home / Self Care | Payer: BC Managed Care – PPO | Source: Ambulatory Visit | Attending: Internal Medicine | Admitting: Internal Medicine

## 2013-09-30 DIAGNOSIS — R05 Cough: Secondary | ICD-10-CM | POA: Insufficient documentation

## 2013-09-30 DIAGNOSIS — R059 Cough, unspecified: Secondary | ICD-10-CM | POA: Insufficient documentation

## 2013-09-30 DIAGNOSIS — J45909 Unspecified asthma, uncomplicated: Secondary | ICD-10-CM

## 2013-09-30 NOTE — Assessment & Plan Note (Signed)
The most common causes of chronic cough in immunocompetent adults include the following: upper airway cough syndrome (UACS), previously referred to as postnasal drip syndrome (PNDS), which is caused by variety of rhinosinus conditions; (2) asthma; (3) GERD; (4) chronic bronchitis from cigarette smoking or other inhaled environmental irritants; (5) nonasthmatic eosinophilic bronchitis; and (6) bronchiectasis.   These conditions, singly or in combination, have accounted for up to 94% of the causes of chronic cough in prospective studies.   Other conditions have constituted no >6% of the causes in prospective studies These have included bronchogenic carcinoma, chronic interstitial pneumonia, sarcoidosis, left ventricular failure, ACEI-induced cough, and aspiration from a condition associated with pharyngeal dysfunction.    Chronic cough is often simultaneously caused by more than one condition. A single cause has been found from 38 to 82% of the time, multiple causes from 18 to 62%. Multiply caused cough has been the result of three diseases up to 42% of the time.      This is most c/w  Classic Upper airway cough syndrome, so named because it's frequently impossible to sort out how much is  CR/sinusitis with freq throat clearing (which can be related to primary GERD)   vs  causing  secondary (" extra esophageal")  GERD from wide swings in gastric pressure that occur with throat clearing, often  promoting self use of mint and menthol lozenges that reduce the lower esophageal sphincter tone and exacerbate the problem further in a cyclical fashion.   These are the same pts (now being labeled as having "irritable larynx syndrome" by some cough centers) who not infrequently have a history of having failed to tolerate ace inhibitors,  dry powder inhalers or biphosphonates or report having atypical reflux symptoms that don't respond to standard doses of PPI , and are easily confused as having aecopd or asthma  flares by even experienced allergists/ pulmonologists.  Since sinus ct shows no acute change rec rx with max gerd rx > See instructions for specific recommendations which were reviewed directly with the patient who was given a copy with highlighter outlining the key components.

## 2013-09-30 NOTE — Assessment & Plan Note (Addendum)
DDX of  difficult airways managment all start with A and  include Adherence, Ace Inhibitors, Acid Reflux, Active Sinus Disease, Alpha 1 Antitripsin deficiency, Anxiety masquerading as Airways dz,  ABPA,  allergy(esp in young), Aspiration (esp in elderly), Adverse effects of DPI,  Active smokers, plus two Bs  = Bronchiectasis and Beta blocker use..and one C= CHF  Adherence is always the initial "prime suspect" and is a multilayered concern that requires a "trust but verify" approach in every patient - starting with knowing how to use medications, especially inhalers, correctly, keeping up with refills and understanding the fundamental difference between maintenance and prns vs those medications only taken for a very short course and then stopped and not refilled.   ? Acid (or non-acid) GERD > always difficult to exclude as up to 75% of pts in some series report no assoc GI/ Heartburn symptoms> rec max (24h)  acid suppression and diet restrictions/ reviewed and instructions given in writing.   ? Active sinus dz > can't exclude by present sinus ct > but just finishing amox and not enough evidence to support continuing rx   ? Allergy/ doubt based on no response to prednisone but continue singulair for now as not harmful  See instructions for specific recommendations which were reviewed directly with the patient who was given a copy with highlighter outlining the key components.

## 2013-10-01 NOTE — Progress Notes (Signed)
Quick Note:  Spoke with pt and notified of results per Dr. Wert. Pt verbalized understanding and denied any questions.  ______ 

## 2013-10-18 ENCOUNTER — Encounter: Payer: Self-pay | Admitting: Internal Medicine

## 2013-10-18 ENCOUNTER — Ambulatory Visit (INDEPENDENT_AMBULATORY_CARE_PROVIDER_SITE_OTHER): Payer: BC Managed Care – PPO | Admitting: Internal Medicine

## 2013-10-18 ENCOUNTER — Encounter (INDEPENDENT_AMBULATORY_CARE_PROVIDER_SITE_OTHER): Payer: Self-pay

## 2013-10-18 ENCOUNTER — Other Ambulatory Visit (INDEPENDENT_AMBULATORY_CARE_PROVIDER_SITE_OTHER): Payer: BC Managed Care – PPO

## 2013-10-18 VITALS — BP 122/80 | HR 90 | Temp 98.1°F | Ht 70.0 in | Wt 212.0 lb

## 2013-10-18 DIAGNOSIS — R059 Cough, unspecified: Secondary | ICD-10-CM

## 2013-10-18 DIAGNOSIS — R05 Cough: Secondary | ICD-10-CM

## 2013-10-18 LAB — CBC WITH DIFFERENTIAL/PLATELET
Basophils Absolute: 0.1 10*3/uL (ref 0.0–0.1)
Basophils Relative: 0.7 % (ref 0.0–3.0)
Eosinophils Absolute: 0.4 10*3/uL (ref 0.0–0.7)
Eosinophils Relative: 5.6 % — ABNORMAL HIGH (ref 0.0–5.0)
HCT: 44.9 % (ref 39.0–52.0)
Hemoglobin: 14.9 g/dL (ref 13.0–17.0)
Lymphocytes Relative: 25.9 % (ref 12.0–46.0)
Lymphs Abs: 2 10*3/uL (ref 0.7–4.0)
MCHC: 33.3 g/dL (ref 30.0–36.0)
MCV: 91.2 fl (ref 78.0–100.0)
Monocytes Absolute: 0.7 10*3/uL (ref 0.1–1.0)
Monocytes Relative: 9 % (ref 3.0–12.0)
Neutro Abs: 4.6 10*3/uL (ref 1.4–7.7)
Neutrophils Relative %: 58.8 % (ref 43.0–77.0)
PLATELETS: 237 10*3/uL (ref 150.0–400.0)
RBC: 4.93 Mil/uL (ref 4.22–5.81)
RDW: 13.9 % (ref 11.5–14.6)
WBC: 7.9 10*3/uL (ref 4.5–10.5)

## 2013-10-18 MED ORDER — FAMOTIDINE 20 MG PO TABS: ORAL_TABLET | ORAL | Status: DC

## 2013-10-18 MED ORDER — AMOXICILLIN-POT CLAVULANATE 875-125 MG PO TABS: 1.0000 | ORAL_TABLET | Freq: Two times a day (BID) | ORAL | Status: DC

## 2013-10-18 MED ORDER — PANTOPRAZOLE SODIUM 40 MG PO TBEC: DELAYED_RELEASE_TABLET | ORAL | Status: DC

## 2013-10-18 NOTE — Progress Notes (Signed)
Subjective:    Patient ID: Victor Best, male    DOB: November 01, 1955   MRN: [id number]   Brief patient profile:  58 yowm never smoker grew out of asthma after given shots by age 58 and no symptoms with exercise in HS but somwhat limited and did not required any meds and did ok until around 2011 started having problems with Oct onset of cough/ wheezing lasts until March each year  and different patterm onset sept 2014 eval by ER in [place] dx as pleurisy with improvement in pain but not wheezing which persisted despite even prednisone so referred 09/29/2013  by Victor Best to pulmonary clinic for refractory asthma.   09/29/2013 1st [facility] office visit/ Victor Best  Chief Complaint  Patient presents with  . Pulmonary Consult    Referred per Dr. Yaakov Best. Pt c/o cough and wheezing since Sept 2014.  Cough is prod with minimal green sputum.  He states that he "wheezing" occurs all of the time.    on singulair and protonix at hs, no response to saba. Cough worse day than night. rec Please see patient coordinator before you leave today  to schedule  Sinus CT > ? sinusitis Pantoprazole (protonix) 40 mg   Take 30-60 min before first meal of the day and Pepcid ac 20 mg one bedtime until return to office - this is the best way to tell whether stomach acid is contributing to your problem.   Continue montelukast 10 mg in evening  GERD  rx Remember if you start wheezing to use the purse lip maneuver.    10/18/2013 f/u ov/Victor Best re: cough Chief Complaint  Patient presents with  . Follow-up    Pt states his wheezing is no better since last visit. He states that he "never really had a cough", but does c/o prod cough with green sputum.     constant green sputum all day long esp though in am but not waking him up    No obvious day to day or daytime variabilty or assoc  cp or chest tightness, subjective wheeze overt sinus or hb symptoms. No unusual exp hx or h/o childhood pna/ asthma or knowledge of premature  birth.  Sleeping ok without nocturnal  or early am exacerbation  of respiratory  c/o's or need for noct saba. Also denies any obvious fluctuation of symptoms with weather or environmental changes or other aggravating or alleviating factors except as outlined above   Current Medications, Allergies, Complete Past Medical History, Past Surgical History, Family History, and Social History were reviewed in [employer] record.  ROS  The following are not active complaints unless bolded sore throat, dysphagia, dental problems, itching, sneezing,  nasal congestion or excess/ purulent secretions, ear ache,   fever, chills, sweats, unintended wt loss, pleuritic or exertional cp, hemoptysis,  orthopnea pnd or leg swelling, presyncope, palpitations, heartburn, abdominal pain, anorexia, nausea, vomiting, diarrhea  or change in bowel or urinary habits, change in stools or urine, dysuria,hematuria,  rash, arthralgias, visual complaints, headache, numbness weakness or ataxia or problems with walking or coordination,  change in mood/affect or memory.                       Objective:   Physical Exam   10/18/2013  212  Wt Readings from Last 3 Encounters:  09/29/13 214 lb 9.6 oz (97.342 kg)  09/23/13 214 lb 9.6 oz (97.342 kg)  09/16/13 215 lb 9.6 oz (97.796 kg)  amb wm nad with wheezing heard across the room on fvc    HEENT: nl dentition, turbinates, and orophanx. Nl external ear canals without cough reflex   NECK :  without JVD/Nodes/TM/ nl carotid upstrokes bilaterally   LUNGS: no acc muscle use, clear to A and P bilaterally without cough on insp or exp maneuvers   CV:  RRR  no s3 or murmur or increase in P2, no edema   ABD:  soft and nontender with nl excursion in the supine position. No bruits or organomegaly, bowel sounds nl  MS:  warm without deformities, calf tenderness, cyanosis or clubbing  SKIN: warm and dry without lesions    NEURO:  alert, approp, no  deficits     09/24/13  CTa No CT evidence of focal or acute thoracic abnormalities.  Specifically there is no CT evidence of pulmonary arterial embolic  Disease.  CT sinus 09/30/2013 > Widespread paranasal sinus mucosal thickening, sparing the frontal sinuses. No definite fluid level or bubbly opacity, but mild acute sinusitis might have this appearance.        Assessment & Plan:

## 2013-10-18 NOTE — Patient Instructions (Addendum)
Augmentin 875 mg take one pill twice daily  X 10 -20  days - take at breakfast and supper with large glass of water.  It would help reduce the usual side effects (diarrhea and yeast infections) if you ate cultured yogurt at lunch.   Pantoprazole (protonix) 40 mg   Take 30-60 min before first meal of the day and Pepcid 20 mg one at bedtime until return to office - this is the best way to tell whether stomach acid is contributing to your problem.    GERD (REFLUX)  is an extremely common cause of respiratory symptoms, many times with no significant heartburn at all.    It can be treated with medication, but also with lifestyle changes including avoidance of late meals, excessive alcohol, smoking cessation, and avoid fatty foods, chocolate, peppermint, colas, red wine, and acidic juices such as orange juice.  NO MINT OR MENTHOL PRODUCTS SO NO COUGH DROPS  USE SUGARLESS CANDY INSTEAD (jolley ranchers or Stover's)  NO OIL BASED VITAMINS - use powdered substitutes.   Please remember to go to the lab   department downstairs for your tests - we will call you with the results when they are available.  Keep your previous appointment.

## 2013-10-19 LAB — ALLERGY PROFILE REGION II-DC, DE, MD, ~~LOC~~, VA
Allergen, D pternoyssinus,d7: 1.27 kU/L — ABNORMAL HIGH
Alternaria Alternata: <0.1 kU/L
Aspergillus fumigatus, m3: <0.1 kU/L
Bermuda Grass: <0.1 kU/L
Box Elder IgE: 0.1 kU/L — ABNORMAL HIGH
Cat Dander: <0.1 kU/L
Cladosporium Herbarum: <0.1 kU/L
Common Ragweed: 0.93 kU/L — ABNORMAL HIGH
D. farinae: 1.16 kU/L — ABNORMAL HIGH
Elm IgE: 0.18 kU/L — ABNORMAL HIGH
IGE (IMMUNOGLOBULIN E), SERUM: 67.9 [IU]/mL (ref 0.0–180.0)
Lamb's Quarters: <0.1 kU/L
Meadow Grass: <0.1 kU/L
Oak: <0.1 kU/L

## 2013-10-19 NOTE — Assessment & Plan Note (Signed)
Symptoms are markedly disproportionate to objective findings and not clear this is a lung problem but pt does appear to have difficult airway management issues.   Adherence is always the initial "prime suspect" and is a multilayered concern that requires a "trust but verify" approach in every patient - starting with knowing how to use medications, especially inhalers, correctly, keeping up with refills and understanding the fundamental difference between maintenance and prns vs those medications only taken for a very short course and then stopped and not refilled.  - note did not follow instructions from prev ov re how to use gerd meds  ? Acid (or non-acid) GERD > always difficult to exclude as up to 75% of pts in some series report no assoc GI/ Heartburn symptoms> rec max (24h)  acid suppression and diet restrictions/ reviewed and instructions given in writing (same instructions, reviewed line by line as did not follow the first time)  Active sinus dz strongly indicated here > given augmentin 10-20 days (whichever needed to completely eliminate the green mucus)  ? Allergy/asthma > allergy profile today and return as scheduled for full pfts - not clear singulair helping but ok to to continue for now

## 2013-10-20 ENCOUNTER — Encounter: Payer: Self-pay | Admitting: Internal Medicine

## 2013-10-20 NOTE — Progress Notes (Signed)
Quick Note:  Spoke with pt and notified of results per Dr. Wert. Pt verbalized understanding and denied any questions.  ______ 

## 2013-11-02 ENCOUNTER — Ambulatory Visit: Payer: BC Managed Care – PPO | Admitting: Internal Medicine

## 2013-11-15 ENCOUNTER — Encounter: Payer: Self-pay | Admitting: Internal Medicine

## 2013-11-15 ENCOUNTER — Ambulatory Visit (INDEPENDENT_AMBULATORY_CARE_PROVIDER_SITE_OTHER): Payer: BC Managed Care – PPO | Admitting: Internal Medicine

## 2013-11-15 VITALS — BP 122/80 | HR 65 | Temp 97.9°F | Ht 70.0 in | Wt 211.0 lb

## 2013-11-15 DIAGNOSIS — R059 Cough, unspecified: Secondary | ICD-10-CM

## 2013-11-15 DIAGNOSIS — R05 Cough: Secondary | ICD-10-CM

## 2013-11-15 NOTE — Progress Notes (Signed)
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 Pulmonary Consult    Referred per Dr. Yaakov Guthrie. Pt c/o cough and wheezing since Sept 2014.  Cough is prod with minimal green sputum.  He states that he "wheezing" occurs all of the time.    on singulair and protonix at hs, no response to saba. Cough worse day than night. rec Please see patient coordinator before you leave today  to schedule  Sinus CT > ? sinusitis Pantoprazole (protonix) 40 mg   Take 30-60 min before first meal of the day and Pepcid ac 20 mg one bedtime until return to office - this is the best way to tell whether stomach acid is contributing to your problem.   Continue montelukast 10 mg in evening  GERD  rx Remember if you start wheezing to use the purse lip maneuver.    10/18/2013 f/u ov/Christpoher Sievers re: cough Chief Complaint  Patient presents with  . Follow-up    Pt states his wheezing is no better since last visit. He states that he "never really had a cough", but does c/o prod cough with green sputum.   constant green sputum all day long esp though in am but not waking him up rec Augmentin 875 mg take one pill twice daily  X 10 -20  days - take at breakfast and supper with large glass of water.  It would help reduce the usual side effects (diarrhea and yeast  infections) if you ate cultured yogurt at lunch.  Pantoprazole (protonix) 40 mg   Take 30-60 min before first meal of the day and Pepcid 20 mg one at bedtime until return to office - this is the best way to tell whether stomach acid is contributing to your problem.   GERD   Please remember to go to the lab > IgE 68 After green mucus cleared stopped all abx and gerd rx/singulair    11/15/2013 f/u ov/Shanna Strength re: prob pseudoasthma Chief Complaint  Patient presents with  . Follow-up    Pt states that his wheezing has improved, but not resolved. No new co's today.  once the sun goes down, typically after supper gets urge to clear throat then hears wheezing at hs and no improvement on singulair/ saba/1st h1 and gerd rx (though not clear he really ever gave any full trial) and wheezes all night long  And better 15 min after rising s doing anything then fine all day long.   No obvious day to day or daytime variabilty or assoc  cp or chest tightness, subjective wheeze overt sinus or hb symptoms. No unusual exp hx or h/o childhood pna/ asthma or knowledge of premature birth.  Sleeping ok without nocturnal  or early am exacerbation  of respiratory  c/o's or  need for noct saba. Also denies any obvious fluctuation of symptoms with weather or environmental changes or other aggravating or alleviating factors except as outlined above   Current Medications, Allergies, Complete Past Medical History, Past Surgical History, Family History, and Social History were reviewed in [employer] record.  ROS  The following are not active complaints unless bolded sore throat, dysphagia, dental problems, itching, sneezing,  nasal congestion or excess/ purulent secretions, ear ache,   fever, chills, sweats, unintended wt loss, pleuritic or exertional cp, hemoptysis,  orthopnea pnd or leg swelling, presyncope, palpitations, heartburn, abdominal pain, anorexia, nausea, vomiting, diarrhea  or change in bowel  or urinary habits, change in stools or urine, dysuria,hematuria,  rash, arthralgias, visual complaints, headache, numbness weakness or ataxia or problems with walking or coordination,  change in mood/affect or memory.                       Objective:   Physical Exam   10/18/2013  212  > 11/15/2013  211  Wt Readings from Last 3 Encounters:  09/29/13 214 lb 9.6 oz (97.342 kg)  09/23/13 214 lb 9.6 oz (97.342 kg)  09/16/13 215 lb 9.6 oz (97.796 kg)      amb wm nad     HEENT: nl dentition, turbinates  Nl external ear canals without cough reflex Mucoid pnd present    NECK :  without JVD/Nodes/TM/ nl carotid upstrokes bilaterally   LUNGS: no acc muscle use, clear to A and P bilaterally without cough on insp or exp maneuvers   CV:  RRR  no s3 or murmur or increase in P2, no edema   ABD:  soft and nontender with nl excursion in the supine position. No bruits or organomegaly, bowel sounds nl  MS:  warm without deformities, calf tenderness, cyanosis or clubbing  SKIN: warm and dry without lesions          09/24/13  CTa No CT evidence of focal or acute thoracic abnormalities.  Specifically there is no CT evidence of pulmonary arterial embolic  Disease.  CT sinus 09/30/2013 > Widespread paranasal sinus mucosal thickening, sparing the frontal sinuses. No definite fluid level or bubbly opacity, but mild acute sinusitis might have this appearance.        Assessment & Plan:

## 2013-11-15 NOTE — Patient Instructions (Addendum)
GERD (REFLUX)  is an extremely common cause of respiratory symptoms, many times with no significant heartburn at all.    It can be treated with medication, but also with lifestyle changes including avoidance of late meals, excessive alcohol, smoking cessation, and avoid fatty foods, chocolate, peppermint, colas, red wine, and acidic juices such as orange juice.  NO MINT OR MENTHOL PRODUCTS SO NO COUGH DROPS  USE SUGARLESS CANDY INSTEAD (jolley ranchers or Stover's or Lifesavers) NO OIL BASED VITAMINS - use powdered substitutes.  Ok to stay off all meds to we sort out your problem  Please see patient coordinator before you leave today  to schedule ENT eval by Wilburn Cornelia since you are already established with him

## 2013-11-16 NOTE — Assessment & Plan Note (Addendum)
-   CT sinus 09/30/2013 > Widespread paranasal sinus mucosal thickening, sparing the frontal sinuses. 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 with positive titers for ragweed, trees and dust  - 11/15/13 spirometry wnl including [id number] same day as reported am wheeze  All signs point to  Classic Upper airway cough syndrome, so named because it's frequently impossible to sort out how much is  CR/sinusitis with freq throat clearing (which can be related to primary GERD)   vs  causing  secondary (" extra esophageal")  GERD from wide swings in gastric pressure that occur with throat clearing, often  promoting self use of mint and menthol lozenges that reduce the lower esophageal sphincter tone and exacerbate the problem further in a cyclical fashion.   These are the same pts (now being labeled as having "irritable larynx syndrome" by some cough centers) who not infrequently have a history of having failed to tolerate ace inhibitors,  dry powder inhalers or biphosphonates or report having atypical reflux symptoms that don't respond to standard doses of PPI , and are easily confused as having aecopd or asthma flares by even experienced allergists/ pulmonologists.   Not clear to me he used any of the meds as instructed but on the other hand this does not appear to be a lung issue and next step is ent eval (has seen Wilburn Cornelia in past for "mastoiditis" per pt).  If dx remains in doubt a methacholine challenge could be considered on max gerd RX x 2 weeks first.   Off all rx for now so encouraged to use hard rock candy to keep from clearing throat.

## 2014-03-28 ENCOUNTER — Ambulatory Visit: Payer: BC Managed Care – PPO | Admitting: Family

## 2014-04-20 ENCOUNTER — Ambulatory Visit: Payer: BC Managed Care – PPO | Admitting: Family Medicine

## 2015-05-29 IMAGING — CT CT PARANASAL SINUSES LIMITED
1 of 2 series · 15 of 19 positions shown, 19 images · non-contrast
Comparison: 10/17/2003 temporal bone CT.

CLINICAL DATA: 57-year-old male with chronic recurrent sinusitis.
Congestion and wheezing. Initial encounter.

EXAM:
CT PARANASAL SINUS WITHOUT CONTRAST
TECHNIQUE: Multidetector CT images of the paranasal sinuses were obtained using
the standard protocol without intravenous contrast.

[Series 4: ltd sinus 3.0 h30s · axial · 0.35mm/px · z∈[-75,+20]mm · 15 of 18 slices shown, 19 images]
[im 2/18  brain]
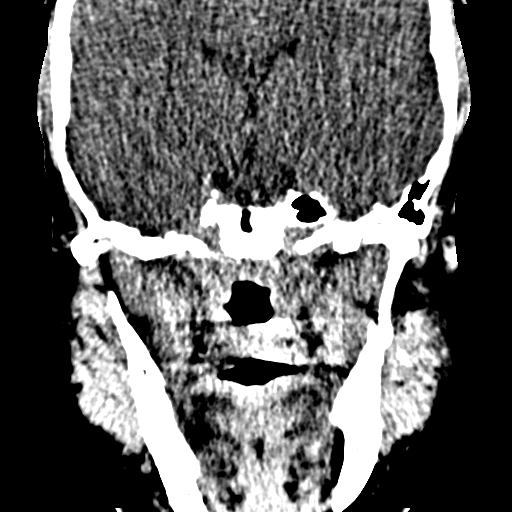
[im 2/18  bone]
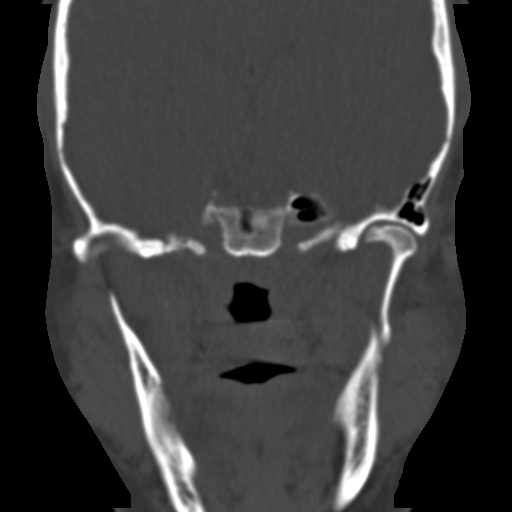
[im 3/18  bone]
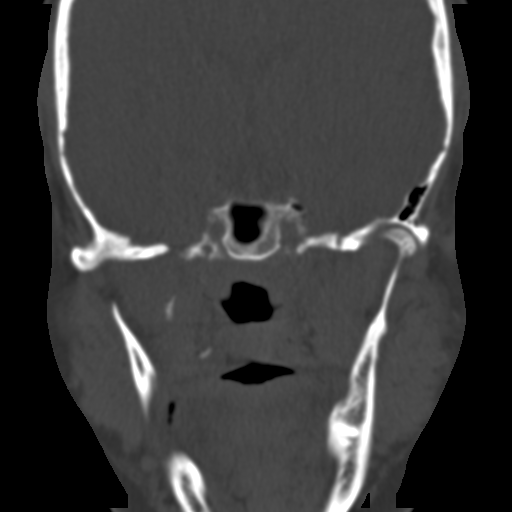
[im 4/18  bone]
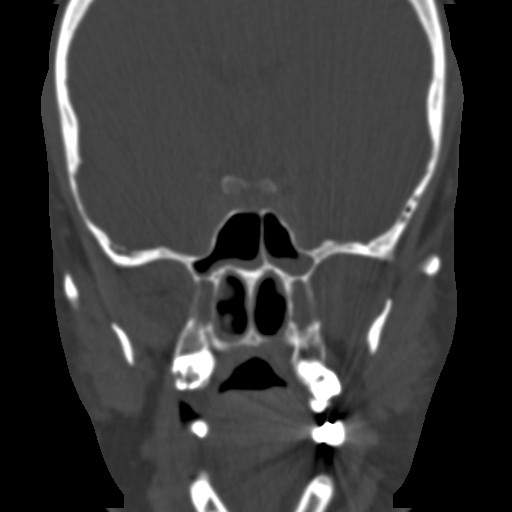
[im 5/18  bone]
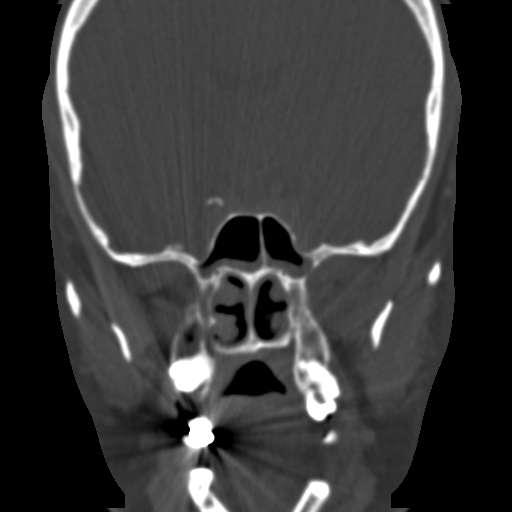
[im 6/18  brain]
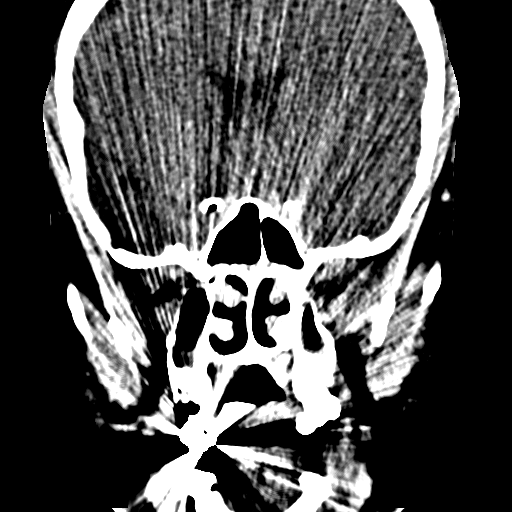
[im 6/18  bone]
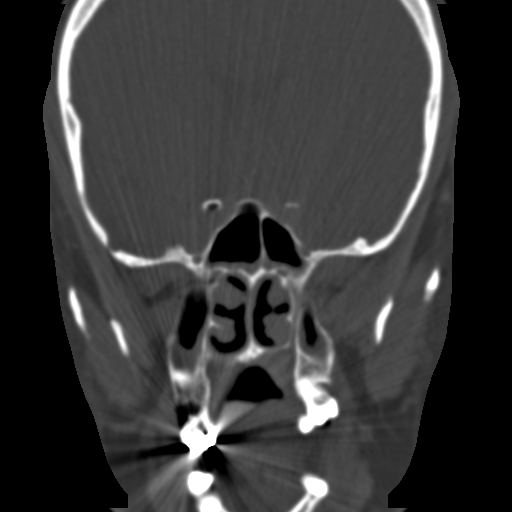
[im 7/18  bone]
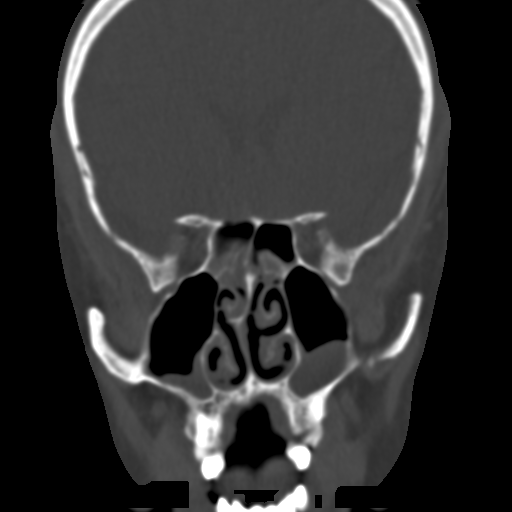
[im 8/18  bone]
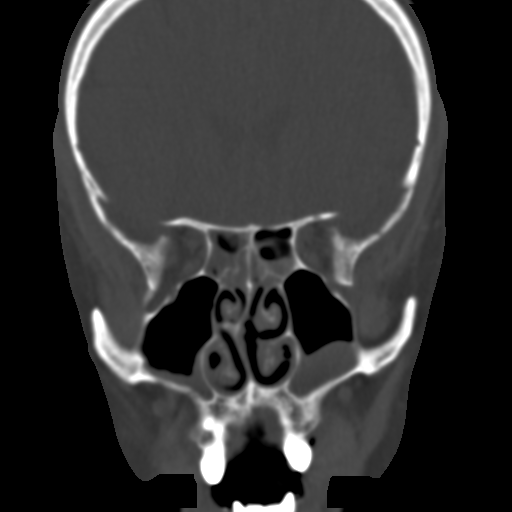
[im 10/18  bone]
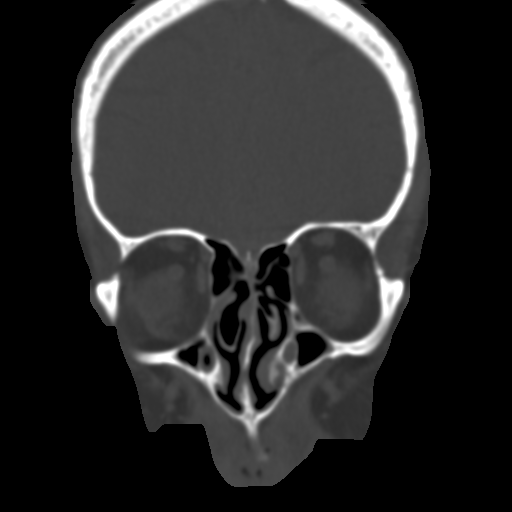
[im 11/18  brain]
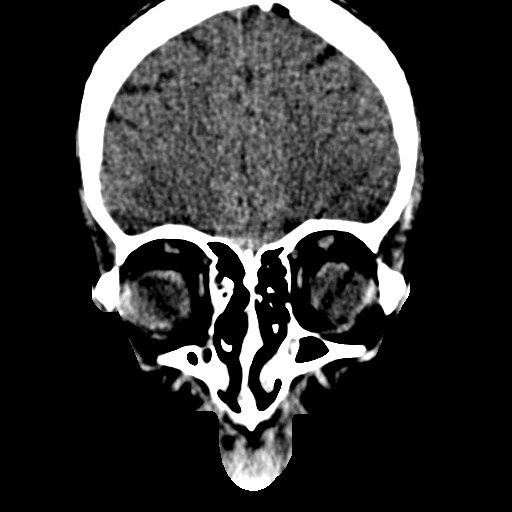
[im 11/18  bone]
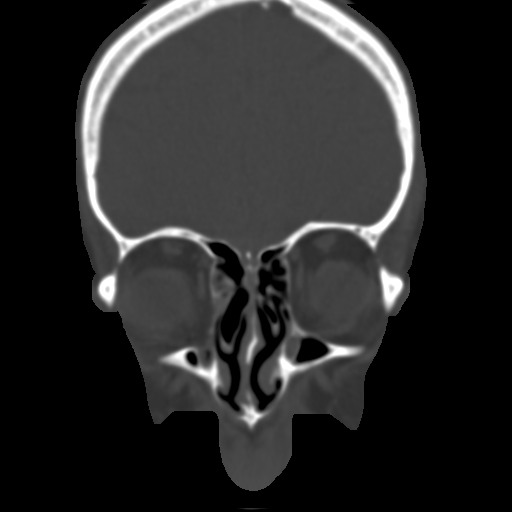
[im 12/18  bone]
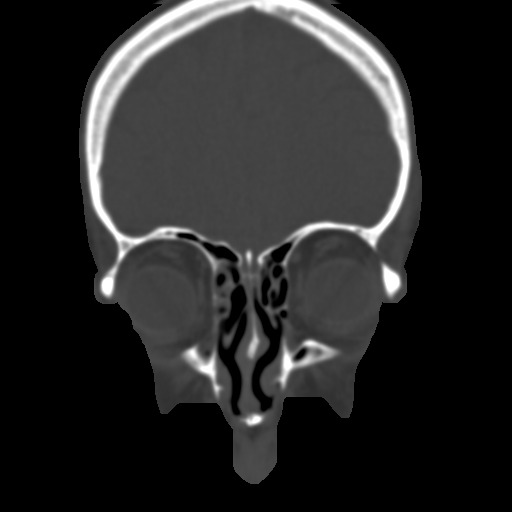
[im 13/18  bone]
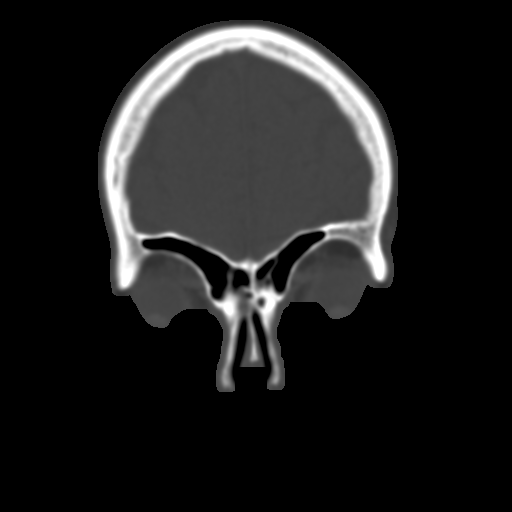
[im 14/18  bone]
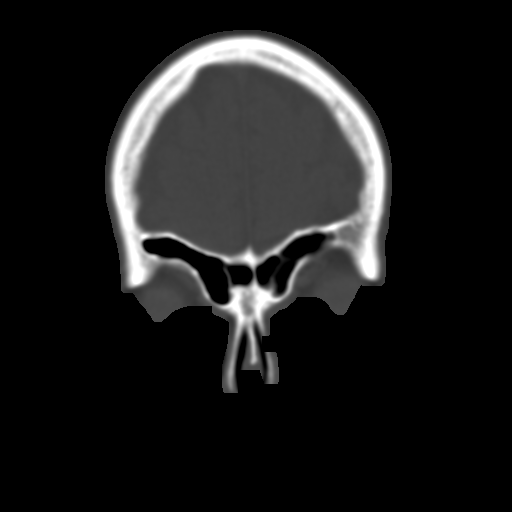
[im 15/18  brain]
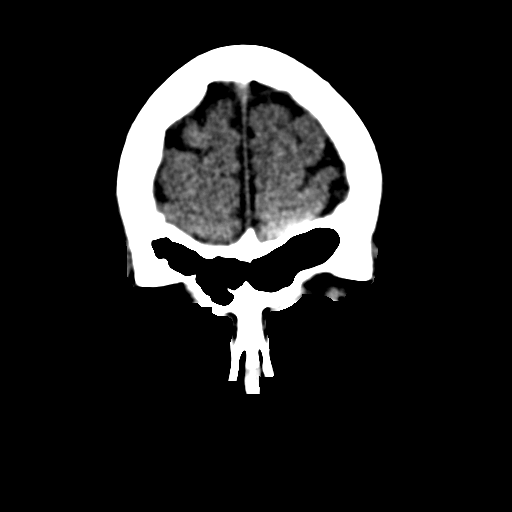
[im 15/18  bone]
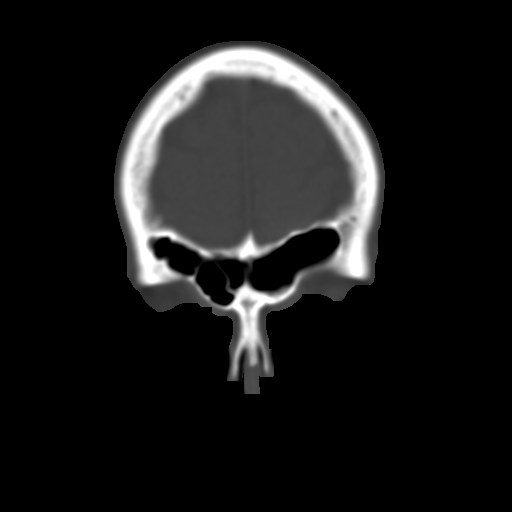
[im 16/18  bone]
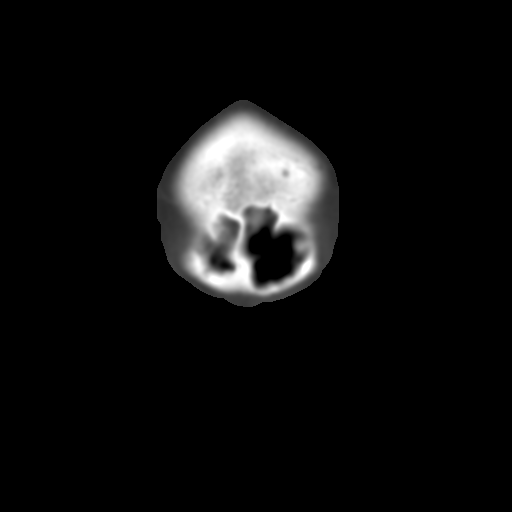
[im 17/18  bone]
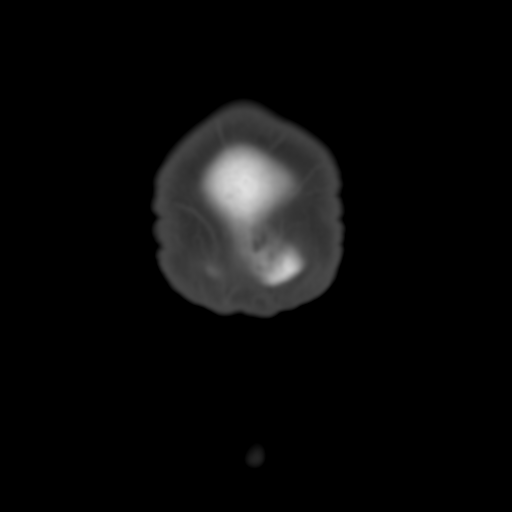

[15 of 19 positions shown; findings below may reference images not displayed]

FINDINGS: Grossly negative visualized non contrast brain parenchyma, orbits
soft tissues, face soft tissues.

Visible mastoid air cells are clear.

Mild to moderate deep tendon mucosal thickening in both sphenoid
sinuses.

Moderate mucosal thickening and opacification of the bilateral
posterior ethmoids.

Mild bilateral anterior ethmoid mucosal thickening.

Frontal sinuses appear spared and clear.

Moderate mucosal thickening or mucous retention cyst at the left
maxillary sinus alveolar recess. Mild to moderate right maxillary
sinus alveolar recess mucosal thickening.

Rightward nasal septal deviation and spurring posteriorly, slight
anterior leftward deviation. Right middle concha bullosa. Small
volume retained secretions in the nasopharynx.
IMPRESSION: Widespread paranasal sinus mucosal thickening, sparing the frontal
sinuses. No definite fluid level or bubbly opacity, but mild acute
sinusitis might have this appearance.

## 2018-12-13 ENCOUNTER — Encounter: Payer: Self-pay | Admitting: Family Medicine

## 2019-09-28 ENCOUNTER — Other Ambulatory Visit: Payer: Self-pay

## 2019-09-29 ENCOUNTER — Encounter: Payer: Self-pay | Admitting: Family Medicine

## 2019-09-29 ENCOUNTER — Ambulatory Visit (INDEPENDENT_AMBULATORY_CARE_PROVIDER_SITE_OTHER): Payer: 59 | Admitting: Family Medicine

## 2019-09-29 ENCOUNTER — Other Ambulatory Visit: Payer: Self-pay

## 2019-09-29 VITALS — BP 118/72 | HR 80 | Temp 98.9°F | Ht 70.0 in | Wt 222.0 lb

## 2019-09-29 DIAGNOSIS — Z7689 Persons encountering health services in other specified circumstances: Secondary | ICD-10-CM

## 2019-09-29 DIAGNOSIS — E669 Obesity, unspecified: Secondary | ICD-10-CM | POA: Diagnosis not present

## 2019-09-29 DIAGNOSIS — Z125 Encounter for screening for malignant neoplasm of prostate: Secondary | ICD-10-CM

## 2019-09-29 DIAGNOSIS — Z0001 Encounter for general adult medical examination with abnormal findings: Secondary | ICD-10-CM | POA: Diagnosis not present

## 2019-09-29 DIAGNOSIS — Z130 Encounter for screening for diseases of the blood and blood-forming organs and certain disorders involving the immune mechanism: Secondary | ICD-10-CM | POA: Diagnosis not present

## 2019-09-29 DIAGNOSIS — Z114 Encounter for screening for human immunodeficiency virus [HIV]: Secondary | ICD-10-CM

## 2019-09-29 DIAGNOSIS — Z1159 Encounter for screening for other viral diseases: Secondary | ICD-10-CM

## 2019-09-29 DIAGNOSIS — Z1211 Encounter for screening for malignant neoplasm of colon: Secondary | ICD-10-CM

## 2019-09-29 DIAGNOSIS — Z0000 Encounter for general adult medical examination without abnormal findings: Secondary | ICD-10-CM

## 2019-09-29 NOTE — Progress Notes (Signed)
[place] is a 64 y.o. male presents to office today to establish care and for for annual physical exam examination.    Concerns today include: 1.  None  Occupation: motorcycles/ competition firearms, Marital status: yes, Substance use: none Diet: fair, Exercise: does not report any structured exercise Last eye exam: UTD Last dental exam: UTD Last colonoscopy: >10 years ago Refills needed today: n/a Immunizations needed: Flu, Tdap declined  Past Medical History:  Diagnosis Date  . Allergy to alpha-gal   . Asthma   . Mastoiditis 2004   hospitalized for this. no recurrent issues  . Obesity    Social History   Socioeconomic History  . Marital status: Married    Spouse name: [place]  . Number of children: 1  . Years of education: Not on file  . Highest education level: Not on file  Occupational History  . Occupation: Furniture conservator/restorer  Tobacco Use  . Smoking status: Never Smoker  . Smokeless tobacco: Never Used  Substance and Sexual Activity  . Alcohol use: No  . Drug use: No  . Sexual activity: Not on file  Other Topics Concern  . 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  He is married to [place] and they reside here in [place].     They have 1 son who sadly passed away after being involved in a motorcycle accident in 2020.     They have 2 grandchildren that are teenagers whom they enjoy spending time with.   Social Determinants of Health   Financial Resource Strain:   . Difficulty of Paying Living Expenses: Not on file  Food Insecurity:   . Worried About Charity fundraiser in the Last Year: Not on file  . Ran Out of Food in the Last Year: Not on file  Transportation Needs:   . Lack of Transportation (Medical): Not on file  . Lack of Transportation (Non-Medical): Not on file  Physical Activity:   . Days of Exercise per Week: Not on file  . Minutes of Exercise per Session: Not on file   Stress:   . Feeling of Stress : Not on file  Social Connections:   . Frequency of Communication with Friends and Family: Not on file  . Frequency of Social Gatherings with Friends and Family: Not on file  . Attends Religious Services: Not on file  . Active Member of Clubs or Organizations: Not on file  . Attends Archivist Meetings: Not on file  . Marital Status: Not on file  Intimate Partner Violence:   . Fear of Current or Ex-Partner: Not on file  . Emotionally Abused: Not on file  . Physically Abused: Not on file  . Sexually Abused: Not on file   Past Surgical History:  Procedure Laterality Date  . KNEE ARTHROSCOPY Right   . TONSILLECTOMY     Family History  Problem Relation Age of Onset  . Heart disease Mother   . Heart attack Mother 38       cause of death  . Diabetes Father   . Cancer Father        brain tumor  . Colon cancer Paternal Uncle   . Asthma Paternal Uncle   . Cancer Paternal Grandfather        ? type  . Suicidality Son    No current outpatient medications on file.  No Known Allergies   ROS: Review  of Systems Constitutional: negative Eyes: positive for contacts/glasses Ears, nose, mouth, throat, and face: negative Respiratory: negative Cardiovascular: negative Gastrointestinal: positive for occasional constipation Genitourinary:negative Integument/breast: negative Hematologic/lymphatic: negative Musculoskeletal:negative Neurological: negative Behavioral/Psych: negative Endocrine: negative Allergic/Immunologic: negative    Physical exam BP 118/72   Pulse 80   Temp 98.9 F (37.2 C) (Temporal)   Ht 5' 10"  (1.778 m)   Wt 222 lb (100.7 kg)   SpO2 96%   BMI 31.85 kg/m  General appearance: alert, cooperative, appears stated age, no distress and mildly obese Head: Normocephalic, without obvious abnormality, atraumatic Eyes: negative findings: lids and lashes normal, conjunctivae and sclerae normal, corneas clear and pupils equal,  round, reactive to light and accomodation Ears: normal TM's and external ear canals both ears Nose: Nares normal. Septum midline. Mucosa normal. No drainage or sinus tenderness. Throat: lips, mucosa, and tongue normal; teeth and gums normal Neck: no adenopathy, no carotid bruit, supple, symmetrical, trachea midline and thyroid not enlarged, symmetric, no tenderness/mass/nodules Back: symmetric, no curvature. ROM normal. No CVA tenderness. Lungs: clear to auscultation bilaterally Chest wall: no tenderness Heart: regular rate and rhythm, S1, S2 normal, no murmur, click, rub or gallop Abdomen: soft, non-tender; bowel sounds normal; no masses,  no organomegaly Male genitalia: deferred Rectal: deferred Extremities: extremities normal, atraumatic, no cyanosis or edema Pulses: 2+ and symmetric Skin: Skin color, texture, turgor normal. No rashes or lesions Lymph nodes: Cervical, supraclavicular, and axillary nodes normal. Neurologic: Alert and oriented X 3, normal strength and tone. Normal symmetric reflexes. Normal coordination and gait  Psych: Mood stable, speech normal, affect appropriate, pleasant and interactive Depression screen [place] 2/9 09/29/2019  Decreased Interest 0  Down, Depressed, Hopeless 0  PHQ - 2 Score 0  Altered sleeping 0  Tired, decreased energy 0  Change in appetite 0  Feeling bad or failure about yourself  0  Trouble concentrating 0  Moving slowly or fidgety/restless 0  Suicidal thoughts 0  PHQ-9 Score 0   Assessment/ Plan: [place] here for annual physical exam.   1. Annual physical exam  2. Obesity (BMI 30.0-34.9) Check fasting labs.  He will return tomorrow to have these done - Lipid panel; Future - CMP14+EGFR; Future - TSH; Future  3. Screening for malignant neoplasm of prostate Asymptomatic.  No known family history of prostate cancer in close relatives - PSA; Future  4. Screening, anemia, deficiency, iron - CBC with Differential; Future  5.  Screening for HIV without presence of risk factors - HIV antibody (with reflex); Future  6. Encounter for hepatitis C screening test for low risk patient - Hepatitis C antibody; Future  7. Establishing care with new doctor, encounter for  8. Screen for colon cancer Appears to have been seen by [facility] in 2013.  I cannot locate the results of this colonoscopy.  Will refer back for repeat as he may be overdue - Ambulatory referral to Gastroenterology   Handout on healthy lifestyle choices, including diet (rich in fruits, vegetables and lean meats and low in salt and simple carbohydrates) and exercise (at least 30 minutes of moderate physical activity daily).  Patient to follow up in 1 year for annual exam or sooner if needed.  Haig Gerardo M. Lajuana Ripple, DO

## 2019-09-30 ENCOUNTER — Encounter: Payer: Self-pay | Admitting: Family Medicine

## 2019-10-01 ENCOUNTER — Other Ambulatory Visit: Payer: 59

## 2019-10-01 ENCOUNTER — Other Ambulatory Visit: Payer: Self-pay

## 2019-10-01 DIAGNOSIS — Z1159 Encounter for screening for other viral diseases: Secondary | ICD-10-CM

## 2019-10-01 DIAGNOSIS — E669 Obesity, unspecified: Secondary | ICD-10-CM

## 2019-10-01 DIAGNOSIS — Z114 Encounter for screening for human immunodeficiency virus [HIV]: Secondary | ICD-10-CM

## 2019-10-01 DIAGNOSIS — Z130 Encounter for screening for diseases of the blood and blood-forming organs and certain disorders involving the immune mechanism: Secondary | ICD-10-CM

## 2019-10-01 DIAGNOSIS — Z125 Encounter for screening for malignant neoplasm of prostate: Secondary | ICD-10-CM

## 2019-10-02 LAB — CBC WITH DIFFERENTIAL/PLATELET
Basophils Absolute: 0.1 10*3/uL (ref 0.0–0.2)
Basos: 1 %
EOS (ABSOLUTE): 0.5 10*3/uL — ABNORMAL HIGH (ref 0.0–0.4)
Eos: 8 %
Hematocrit: 46.6 % (ref 37.5–51.0)
Hemoglobin: 16 g/dL (ref 13.0–17.7)
Immature Grans (Abs): 0 10*3/uL (ref 0.0–0.1)
Immature Granulocytes: 0 %
Lymphocytes Absolute: 1.8 10*3/uL (ref 0.7–3.1)
Lymphs: 30 %
MCH: 30.7 pg (ref 26.6–33.0)
MCHC: 34.3 g/dL (ref 31.5–35.7)
MCV: 89 fL (ref 79–97)
Monocytes Absolute: 0.6 10*3/uL (ref 0.1–0.9)
Monocytes: 10 %
Neutrophils Absolute: 3 10*3/uL (ref 1.4–7.0)
Neutrophils: 51 %
Platelets: 224 10*3/uL (ref 150–450)
RBC: 5.21 x10E6/uL (ref 4.14–5.80)
RDW: 12.8 % (ref 11.6–15.4)
WBC: 6 10*3/uL (ref 3.4–10.8)

## 2019-10-02 LAB — CMP14+EGFR
ALT: 20 IU/L (ref 0–44)
AST: 17 IU/L (ref 0–40)
Albumin/Globulin Ratio: 1.6 (ref 1.2–2.2)
Albumin: 4.3 g/dL (ref 3.8–4.8)
Alkaline Phosphatase: 70 IU/L (ref 39–117)
BUN/Creatinine Ratio: 17 (ref 10–24)
BUN: 17 mg/dL (ref 8–27)
Bilirubin Total: 0.4 mg/dL (ref 0.0–1.2)
CO2: 24 mmol/L (ref 20–29)
Calcium: 9.4 mg/dL (ref 8.6–10.2)
Chloride: 103 mmol/L (ref 96–106)
Creatinine, Ser: 1 mg/dL (ref 0.76–1.27)
GFR calc Af Amer: 93 mL/min/{1.73_m2} (ref >59)
GFR calc non Af Amer: 81 mL/min/{1.73_m2} (ref >59)
Globulin, Total: 2.7 g/dL (ref 1.5–4.5)
Glucose: 84 mg/dL (ref 65–99)
Potassium: 4.5 mmol/L (ref 3.5–5.2)
Sodium: 142 mmol/L (ref 134–144)
Total Protein: 7 g/dL (ref 6.0–8.5)

## 2019-10-02 LAB — PSA: Prostate Specific Ag, Serum: 1.1 ng/mL (ref 0.0–4.0)

## 2019-10-02 LAB — LIPID PANEL
Chol/HDL Ratio: 5.9 ratio — ABNORMAL HIGH (ref 0.0–5.0)
Cholesterol, Total: 264 mg/dL — ABNORMAL HIGH (ref 100–199)
HDL: 45 mg/dL (ref >39)
LDL Chol Calc (NIH): 187 mg/dL — ABNORMAL HIGH (ref 0–99)
Triglycerides: 169 mg/dL — ABNORMAL HIGH (ref 0–149)
VLDL Cholesterol Cal: 32 mg/dL (ref 5–40)

## 2019-10-02 LAB — TSH: TSH: 1.69 u[IU]/mL (ref 0.450–4.500)

## 2019-10-02 LAB — HEPATITIS C ANTIBODY: Hep C Virus Ab: <0.1 s/co ratio (ref 0.0–0.9)

## 2019-10-02 LAB — HIV ANTIBODY (ROUTINE TESTING W REFLEX): HIV Screen 4th Generation wRfx: NONREACTIVE

## 2019-10-04 ENCOUNTER — Other Ambulatory Visit: Payer: Self-pay

## 2019-10-04 DIAGNOSIS — E7800 Pure hypercholesterolemia, unspecified: Secondary | ICD-10-CM

## 2019-11-30 ENCOUNTER — Encounter: Payer: Self-pay | Admitting: Nurse Practitioner

## 2019-11-30 ENCOUNTER — Telehealth (INDEPENDENT_AMBULATORY_CARE_PROVIDER_SITE_OTHER): Payer: 59 | Admitting: Nurse Practitioner

## 2019-11-30 DIAGNOSIS — J0110 Acute frontal sinusitis, unspecified: Secondary | ICD-10-CM

## 2019-11-30 MED ORDER — AMOXICILLIN-POT CLAVULANATE 875-125 MG PO TABS: 1.0000 | ORAL_TABLET | Freq: Two times a day (BID) | ORAL | 0 refills | Status: DC

## 2019-11-30 NOTE — Progress Notes (Signed)
Virtual Visit via video Note   Due to COVID-19 pandemic this visit was conducted virtually. This visit type was conducted due to national recommendations for restrictions regarding the COVID-19 Pandemic (e.g. social distancing, sheltering in place) in an effort to limit this patient's exposure and mitigate transmission in our community. All issues noted in this document were discussed and addressed.  A physical exam was not performed with this format.  I connected with  [place]  on 11/30/19 at 11:10 by video and verified that I am speaking with the correct person using two identifiers. [place] is currently located at work and no one is currently with him during visit. The provider, Mary-Margaret Hassell Done, FNP is located in their office at time of visit.  I discussed the limitations, risks, security and privacy concerns of performing an evaluation and management service by telephone and the availability of in person appointments. I also discussed with the patient that there may be a patient responsible charge related to this service. The patient expressed understanding and agreed to proceed.   History and Present Illness:   Chief Complaint: Sinusitis   HPI Patient c/o nasal congestion and cough that he has had for over 1 week. Discharge started out clear  but now is green and thick. He is starting to feel very fatigued with a headache developing today. He denies any covid exposure.    Review of Systems  Constitutional: Positive for malaise/fatigue. Negative for chills and fever.  HENT: Positive for congestion, ear pain, sinus pain and sore throat (slight).   Eyes: Negative.   Respiratory: Positive for cough (productive at times). Negative for shortness of breath.   Cardiovascular: Negative.   Neurological: Positive for headaches.  Psychiatric/Behavioral: Negative.   All other systems reviewed and are negative.      Observations/Objective: Alert and oriented- answers  all questions appropriately No distress Face flushed 'voice hoarse Slight wet cough noted  Assessment and Plan: [place] in today with chief complaint of Sinusitis   1. Acute non-recurrent frontal sinusitis 1. Take meds as prescribed 2. Use a cool mist humidifier especially during the winter months and when heat has been humid. 3. Use saline nose sprays frequently 4. Saline irrigations of the nose can be very helpful if done frequently.  * 4X daily for 1 week*  * Use of a nettie pot can be helpful with this. Follow directions with this* 5. Drink plenty of fluids 6. Keep thermostat turn down low 7.For any cough or congestion  Use plain Mucinex- regular strength or max strength is fine   * Children- consult with Pharmacist for dosing 8. For fever or aces or pains- take tylenol or ibuprofen appropriate for age and weight.  * for fevers greater than 101 orally you may alternate ibuprofen and tylenol every  3 hours.   Meds ordered this encounter  Medications  . amoxicillin-clavulanate (AUGMENTIN) 875-125 MG tablet    Sig: Take 1 tablet by mouth 2 (two) times daily.    Dispense:  14 tablet    Refill:  0    Order Specific Question:   Supervising Provider    Answer:   Caryl Pina A [id number]     Follow Up Instructions: prn    I discussed the assessment and treatment plan with the patient. The patient was provided an opportunity to ask questions and all were answered. The patient agreed with the plan and demonstrated an understanding of the instructions.   The patient was  advised to call back or seek an in-person evaluation if the symptoms worsen or if the condition fails to improve as anticipated.  The above assessment and management plan was discussed with the patient. The patient verbalized understanding of and has agreed to the management plan. Patient is aware to call the clinic if symptoms persist or worsen. Patient is aware when to return to the clinic for a  follow-up visit. Patient educated on when it is appropriate to go to the emergency department.   Time call ended:11:15 I provided 15 minutes of face-to-face time during this encounter.    Mary-Margaret Hassell Done, FNP

## 2020-01-03 ENCOUNTER — Other Ambulatory Visit: Payer: Self-pay

## 2020-01-03 ENCOUNTER — Ambulatory Visit: Payer: 59 | Admitting: Family Medicine

## 2020-01-03 ENCOUNTER — Encounter: Payer: Self-pay | Admitting: Family Medicine

## 2020-01-03 VITALS — BP 137/74 | HR 71 | Temp 98.4°F | Ht 70.0 in | Wt 205.5 lb

## 2020-01-03 DIAGNOSIS — R0789 Other chest pain: Secondary | ICD-10-CM | POA: Diagnosis not present

## 2020-01-03 DIAGNOSIS — F4310 Post-traumatic stress disorder, unspecified: Secondary | ICD-10-CM

## 2020-01-03 MED ORDER — HYDROXYZINE HCL 25 MG PO TABS: 25.0000 mg | ORAL_TABLET | Freq: Three times a day (TID) | ORAL | 1 refills | Status: DC | PRN

## 2020-01-03 MED ORDER — SERTRALINE HCL 50 MG PO TABS: 50.0000 mg | ORAL_TABLET | Freq: Every day | ORAL | 5 refills | Status: DC

## 2020-01-03 NOTE — Progress Notes (Signed)
BP 137/74   Pulse 71   Temp 98.4 F (36.9 C) (Temporal)   Ht 5\' 10"  (1.778 m)   Wt 205 lb 8 oz (93.2 kg)   BMI 29.49 kg/m    Subjective:   Patient ID: [place], male    DOB: [birth year], 64 y.o.   MRN: [id number]  HPI: [place] is a 64 y.o. male presenting on 01/03/2020 for Insomnia   HPI Patient is coming in today describing some issues where at nighttime especially he will get chest tightness and heart pounding and feels very anxious.  He says all this started last year when his son passed away from suicide by shooting himself in the head in front of them and then he performed CPR and his son until EMS came and declared him dead.  He says when he tries to fall asleep at night he will get like this sometimes especially if he is going by or near the room that his son shot himself.  He says his heart will pound and he gets chest tightness and he feels very anxious.  He says he has been having some flashbacks and the occasional nightmares as well and has been going on for a year and has not been improving.  Relevant past medical, surgical, family and social history reviewed and updated as indicated. Interim medical history since our last visit reviewed. Allergies and medications reviewed and updated.  Review of Systems  Constitutional: Negative for chills and fever.  Respiratory: Positive for chest tightness. Negative for shortness of breath and wheezing.   Cardiovascular: Positive for palpitations. Negative for chest pain and leg swelling.  Musculoskeletal: Negative for back pain and gait problem.  Skin: Negative for rash.  Psychiatric/Behavioral: Positive for dysphoric mood and sleep disturbance. Negative for decreased concentration, self-injury and suicidal ideas. The patient is nervous/anxious.   All other systems reviewed and are negative.   Per HPI unless specifically indicated above   Allergies as of 01/03/2020   No Known Allergies     Medication List       Accurate as of Jan 03, 2020  2:24 PM. If you have any questions, ask your nurse or doctor.        STOP taking these medications   amoxicillin-clavulanate 875-125 MG tablet Commonly known as: AUGMENTIN Stopped by: Fransisca Kaufmann Taiyana Kissler, MD     TAKE these medications   hydrOXYzine 25 MG tablet Commonly known as: ATARAX/VISTARIL Take 1 tablet (25 mg total) by mouth 3 (three) times daily as needed. Started by: Worthy Rancher, MD   sertraline 50 MG tablet Commonly known as: Zoloft Take 1 tablet (50 mg total) by mouth daily. Started by: Fransisca Kaufmann Mayleen Borrero, MD        Objective:   BP 137/74   Pulse 71   Temp 98.4 F (36.9 C) (Temporal)   Ht 5\' 10"  (1.778 m)   Wt 205 lb 8 oz (93.2 kg)   BMI 29.49 kg/m   Wt Readings from Last 3 Encounters:  01/03/20 205 lb 8 oz (93.2 kg)  09/29/19 222 lb (100.7 kg)  11/15/13 211 lb (95.7 kg)    Physical Exam Vitals and nursing note reviewed.  Constitutional:      General: He is not in acute distress.    Appearance: He is well-developed. He is not diaphoretic.  Eyes:     General: No scleral icterus.    Conjunctiva/sclera: Conjunctivae normal.  Neck:     Thyroid: No thyromegaly.  Cardiovascular:     Rate and Rhythm: Normal rate and regular rhythm.     Heart sounds: Normal heart sounds. No murmur.  Pulmonary:     Effort: Pulmonary effort is normal. No respiratory distress.     Breath sounds: Normal breath sounds. No wheezing.  Musculoskeletal:        General: Normal range of motion.     Cervical back: Neck supple.  Lymphadenopathy:     Cervical: No cervical adenopathy.  Skin:    General: Skin is warm and dry.     Findings: No rash.  Neurological:     Mental Status: He is alert and oriented to person, place, and time.     Coordination: Coordination normal.  Psychiatric:        Behavior: Behavior normal.      EKG: Normal sinus rhythm Assessment & Plan:   Problem List Items Addressed This Visit    None    Visit  Diagnoses    Chest tightness    -  Primary   Relevant Orders   EKG 12-Lead (Completed)   PTSD (post-traumatic stress disorder)       Relevant Medications   sertraline (ZOLOFT) 50 MG tablet   hydrOXYzine (ATARAX/VISTARIL) 25 MG tablet      Recommended counseling for possible PTSD and will start Zoloft and hydroxyzine to help in the meantime.  Follow-up in 3 to 4 weeks.  Gave list of some of the counselors in the area. Follow up plan: Return in about 4 weeks (around 01/31/2020), or if symptoms worsen or fail to improve, for Follow-up chest tightness and dreams.  Patient does not want a diagnosis of psychiatric because of his current job, will restrict the note to not be shared because of this. Counseling provided for all of the vaccine components Orders Placed This Encounter  Procedures  . EKG 12-Lead    Caryl Pina, MD [facility] 01/03/2020, 2:24 PM

## 2020-03-03 ENCOUNTER — Other Ambulatory Visit: Payer: Self-pay

## 2020-03-03 ENCOUNTER — Ambulatory Visit (INDEPENDENT_AMBULATORY_CARE_PROVIDER_SITE_OTHER): Payer: 59 | Admitting: Family Medicine

## 2020-03-03 DIAGNOSIS — F5104 Psychophysiologic insomnia: Secondary | ICD-10-CM

## 2020-03-03 MED ORDER — TRAZODONE HCL 50 MG PO TABS: 25.0000 mg | ORAL_TABLET | Freq: Every evening | ORAL | 3 refills | Status: DC | PRN

## 2020-03-03 NOTE — Patient Instructions (Signed)

## 2020-03-03 NOTE — Progress Notes (Signed)
Telephone visit  Subjective: CC: insomnia PCP: Victor Norlander, DO [place] is a 64 y.o. male calls for telephone consult today. Patient provides verbal consent for consult held via phone.  Due to COVID-19 pandemic this visit was conducted virtually. This visit type was conducted due to national recommendations for restrictions regarding the COVID-19 Pandemic (e.g. social distancing, sheltering in place) in an effort to limit this patient's exposure and mitigate transmission in our community. All issues noted in this document were discussed and addressed.  A physical exam was not performed with this format.   Location of patient: home Location of provider: [facility] Others present for call: wife  1. Insomnia Patient reports that he thinks he has COVID.  He has known exposure to COVID positive people recently.  Hence, he calls for telephone consult today.  He has had increasing difficulty falling asleep.  He states that he only gets about 30 minutes of rest per sleep cycle.  He does not feel especially anxious prior to trying to fall asleep but does have quite a sad history with the loss of his son recently.  He was seen by one of my partners a couple months ago and started on sertraline and hydroxyzine.  He notes that neither really made a difference in his mood nor helped him sleep any better.  He certainly feels that his sleep is gotten worse since that time.  He abandoned the medications as he did not find a great deal of difference with the medicines.   ROS: Per HPI  No Known Allergies Past Medical History:  Diagnosis Date   Allergy to alpha-gal    Asthma    Mastoiditis 2004   hospitalized for this. no recurrent issues   Obesity     Current Outpatient Medications:    hydrOXYzine (ATARAX/VISTARIL) 25 MG tablet, Take 1 tablet (25 mg total) by mouth 3 (three) times daily as needed., Disp: 30 tablet, Rfl: 1   sertraline (ZOLOFT) 50 MG tablet, Take 1 tablet (50 mg total)  by mouth daily., Disp: 30 tablet, Rfl: 5  Assessment/ Plan: 64 y.o. male   1. Psychophysiological insomnia Trial trazodone.  Follow up in 2-3 weeks for sleep/ anxiety.  Patient will contact me sooner if he has concerns. - traZODone (DESYREL) 50 MG tablet; Take 0.5-2 tablets (25-100 mg total) by mouth at bedtime as needed for sleep.  Dispense: 30 tablet; Refill: 3   Start time: 1:11pm End time: 1:18pm  Total time spent on patient care (including telephone call/ virtual visit): 10 minutes  [name], [place] [phone]

## 2020-03-20 ENCOUNTER — Ambulatory Visit: Payer: 59 | Admitting: Family Medicine

## 2020-03-20 ENCOUNTER — Encounter: Payer: Self-pay | Admitting: Family Medicine

## 2020-03-20 NOTE — Progress Notes (Deleted)
Subjective: CC: insomnia follow up PCP: Janora Norlander, DO KPT:[id number] B [place] is a 64 y.o. male presenting to clinic today for:  1. Insomnia ***   ROS: Per HPI  No Known Allergies Past Medical History:  Diagnosis Date  . Allergy to alpha-gal   . Asthma   . Mastoiditis 2004   hospitalized for this. no recurrent issues  . Obesity     Current Outpatient Medications:  .  traZODone (DESYREL) 50 MG tablet, Take 0.5-2 tablets (25-100 mg total) by mouth at bedtime as needed for sleep., Disp: 30 tablet, Rfl: 3 Social History   Socioeconomic History  . Marital status: Married    Spouse name: Lattie Haw  . Number of children: 1  . Years of education: Not on file  . Highest education level: Not on file  Occupational History  . Occupation: Furniture conservator/restorer  Tobacco Use  . Smoking status: Never Smoker  . Smokeless tobacco: Never Used  Vaping Use  . Vaping Use: Never used  Substance and Sexual Activity  . Alcohol use: No  . Drug use: No  . Sexual activity: Not on file  Other Topics Concern  . 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  He is married to [place] and they reside here in [place].     They have 1 son who sadly passed away after being involved in a motorcycle accident in 2020.     They have 2 grandchildren that are teenagers whom they enjoy spending time with.   Social Determinants of Health   Financial Resource Strain:   . Difficulty of Paying Living Expenses:   Food Insecurity:   . Worried About Charity fundraiser in the Last Year:   . Arboriculturist in the Last Year:   Transportation Needs:   . Film/video editor (Medical):   Marland Kitchen Lack of Transportation (Non-Medical):   Physical Activity:   . Days of Exercise per Week:   . Minutes of Exercise per Session:   Stress:   . Feeling of Stress :   Social Connections:   . Frequency of Communication with Friends and Family:   . Frequency  of Social Gatherings with Friends and Family:   . Attends Religious Services:   . Active Member of Clubs or Organizations:   . Attends Archivist Meetings:   Marland Kitchen Marital Status:   Intimate Partner Violence:   . Fear of Current or Ex-Partner:   . Emotionally Abused:   Marland Kitchen Physically Abused:   . Sexually Abused:    Family History  Problem Relation Age of Onset  . Heart disease Mother   . Heart attack Mother 33       cause of death  . Diabetes Father   . Cancer Father        brain tumor  . Colon cancer Paternal Uncle   . Asthma Paternal Uncle   . Cancer Paternal Grandfather        ? type  . Suicidality Son     Objective: Office vital signs reviewed. There were no vitals taken for this visit.  Physical Examination:  General: Awake, alert, *** nourished, No acute distress HEENT: Normal    Neck: No masses palpated. No lymphadenopathy    Ears: Tympanic membranes intact, normal light reflex, no erythema, no bulging    Eyes: PERRLA, extraocular membranes intact, sclera ***    Nose: nasal turbinates moist, ***  nasal discharge    Throat: moist mucus membranes, no erythema, *** tonsillar exudate.  Airway is patent Cardio: regular rate and rhythm, S1S2 heard, no murmurs appreciated Pulm: clear to auscultation bilaterally, no wheezes, rhonchi or rales; normal work of breathing on room air GI: soft, non-tender, non-distended, bowel sounds present x4, no hepatomegaly, no splenomegaly, no masses GU: external vaginal tissue ***, cervix ***, *** punctate lesions on cervix appreciated, *** discharge from cervical os, *** bleeding, *** cervical motion tenderness, *** abdominal/ adnexal masses Extremities: warm, well perfused, No edema, cyanosis or clubbing; +*** pulses bilaterally MSK: *** gait and *** station Skin: dry; intact; no rashes or lesions Neuro: *** Strength and light touch sensation grossly intact, *** DTRs ***/4  Assessment/ Plan: 64 y.o. male   ***  No orders of the  defined types were placed in this encounter.  No orders of the defined types were placed in this encounter.    Janora Norlander, DO [place] [phone]

## 2020-05-31 ENCOUNTER — Ambulatory Visit: Payer: 59 | Admitting: Family Medicine

## 2020-06-12 ENCOUNTER — Encounter: Payer: Self-pay | Admitting: Family

## 2020-06-12 ENCOUNTER — Ambulatory Visit (INDEPENDENT_AMBULATORY_CARE_PROVIDER_SITE_OTHER): Payer: 59 | Admitting: Family

## 2020-06-12 DIAGNOSIS — J019 Acute sinusitis, unspecified: Secondary | ICD-10-CM

## 2020-06-12 MED ORDER — AMOXICILLIN-POT CLAVULANATE 875-125 MG PO TABS: 1.0000 | ORAL_TABLET | Freq: Two times a day (BID) | ORAL | 0 refills | Status: DC

## 2020-06-12 NOTE — Progress Notes (Signed)
   Virtual Visit via telephone Note Due to COVID-19 pandemic this visit was conducted virtually. This visit type was conducted due to national recommendations for restrictions regarding the COVID-19 Pandemic (e.g. social distancing, sheltering in place) in an effort to limit this patient's exposure and mitigate transmission in our community. All issues noted in this document were discussed and addressed.  A physical exam was not performed with this format.  I connected with [name] on 06/12/20 at 11:33 AM  by telephone and verified that I am speaking with the correct person using two identifiers. KAELON WEEKES is currently located at home and wife is currently with him during visit. The provider, Evelina Dun, FNP is located in their office at time of visit.  I discussed the limitations, risks, security and privacy concerns of performing an evaluation and management service by telephone and the availability of in person appointments. I also discussed with the patient that there may be a patient responsible charge related to this service. The patient expressed understanding and agreed to proceed.   History and Present Illness:  Sinusitis This is a new problem. The current episode started 1 to 4 weeks ago. The problem has been gradually worsening since onset. His pain is at a severity of 3/10. Associated symptoms include congestion, sinus pressure and sneezing. Pertinent negatives include no coughing, ear pain, headaches, shortness of breath or sore throat. (Right sided congestion, wheezing) Past treatments include oral decongestants. The treatment provided mild relief.      Review of Systems  HENT: Positive for congestion, sinus pressure and sneezing. Negative for ear pain and sore throat.   Respiratory: Negative for cough and shortness of breath.   Neurological: Negative for headaches.     Observations/Objective: No SOB or distress noted   Assessment and Plan: 1. Acute sinusitis,  recurrence not specified, unspecified location - Take meds as prescribed - Use a cool mist humidifier  -Use saline nose sprays frequently -Force fluids -For any cough or congestion  Use plain Mucinex- regular strength or max strength is fine -For fever or aces or pains- take tylenol or ibuprofen. -Throat lozenges if help -RTO if symptoms worsen or do not improve  - amoxicillin-clavulanate (AUGMENTIN) 875-125 MG tablet; Take 1 tablet by mouth 2 (two) times daily.  Dispense: 14 tablet; Refill: 0     I discussed the assessment and treatment plan with the patient. The patient was provided an opportunity to ask questions and all were answered. The patient agreed with the plan and demonstrated an understanding of the instructions.   The patient was advised to call back or seek an in-person evaluation if the symptoms worsen or if the condition fails to improve as anticipated.  The above assessment and management plan was discussed with the patient. The patient verbalized understanding of and has agreed to the management plan. Patient is aware to call the clinic if symptoms persist or worsen. Patient is aware when to return to the clinic for a follow-up visit. Patient educated on when it is appropriate to go to the emergency department.   Time call ended:  11:44 AM   I provided 11 minutes of non-face-to-face time during this encounter.    Evelina Dun, FNP

## 2020-07-06 ENCOUNTER — Encounter: Payer: Self-pay | Admitting: Nurse Practitioner

## 2020-07-06 ENCOUNTER — Ambulatory Visit (INDEPENDENT_AMBULATORY_CARE_PROVIDER_SITE_OTHER): Payer: 59 | Admitting: Nurse Practitioner

## 2020-07-06 DIAGNOSIS — R0981 Nasal congestion: Secondary | ICD-10-CM

## 2020-07-06 MED ORDER — FLUTICASONE PROPIONATE 50 MCG/ACT NA SUSP: 2.0000 | Freq: Every day | NASAL | 6 refills | Status: DC

## 2020-07-06 MED ORDER — CHLORPHEN-PE-ACETAMINOPHEN 4-10-325 MG PO TABS: 1.0000 | ORAL_TABLET | Freq: Four times a day (QID) | ORAL | 0 refills | Status: DC | PRN

## 2020-07-06 NOTE — Progress Notes (Signed)
Virtual Visit via telephone Note Due to COVID-19 pandemic this visit was conducted virtually. This visit type was conducted due to national recommendations for restrictions regarding the COVID-19 Pandemic (e.g. social distancing, sheltering in place) in an effort to limit this patient's exposure and mitigate transmission in our community. All issues noted in this document were discussed and addressed.  A physical exam was not performed with this format.  I connected with Victor Best on 07/06/20 at 12:10 by telephone and verified that I am speaking with the correct person using two identifiers. Victor Best is currently located at home  and his wife is currently with him during visit. The provider, Mary-Margaret Hassell Done, FNP is located in their office at time of visit.  I discussed the limitations, risks, security and privacy concerns of performing an evaluation and management service by telephone and the availability of in person appointments. I also discussed with the patient that there may be a patient responsible charge related to this service. The patient expressed understanding and agreed to proceed.   History and Present Illness:   Chief Complaint: Sinusitis   HPI Patient calls in stating that he does not feel well. He is c/o nasal congestion greater in right then left. Throat scratchy. Has been going on for 6 weeks. Has slight wheezing. Facial pressure only on right side. He has taken some benadryl but no help. He did take augmentin about 3 weeks ago, but did not completely clear up.    Review of Systems  Constitutional: Negative for chills and fever.  HENT: Positive for congestion and sinus pain. Negative for sore throat.   Respiratory: Negative for cough.   Neurological: Negative for headaches.  All other systems reviewed and are negative.    Observations/Objective: Alert and oriented- answers all questions appropriately No distress Voice normal No cough  noted    Assessment and Plan:  Victor Best in today with chief complaint of Sinusitis    1. Nasal congestion 1. Take meds as prescribed 2. Use a cool mist humidifier especially during the winter months and when heat has been humid. 3. Use saline nose sprays frequently 4. Saline irrigations of the nose can be very helpful if done frequently.  * 4X daily for 1 week*  * Use of a nettie pot can be helpful with this. Follow directions with this* 5. Drink plenty of fluids 6. Keep thermostat turn down low 7.For any cough or congestion- norel AD as rx 8. For fever or aces or pains- take tylenol or ibuprofen appropriate for age and weight.  * for fevers greater than 101 orally you may alternate ibuprofen and tylenol every  3 hours.   Meds ordered this encounter  Medications  . Chlorphen-PE-Acetaminophen 4-10-325 MG TABS    Sig: Take 1 tablet by mouth every 6 (six) hours as needed.    Dispense:  20 tablet    Refill:  0    Order Specific Question:   Supervising Provider    Answer:   Caryl Pina A [id number]  . fluticasone (FLONASE) 50 MCG/ACT nasal spray    Sig: Place 2 sprays into both nostrils daily.    Dispense:  16 g    Refill:  6    Order Specific Question:   Supervising Provider    Answer:   Caryl Pina A [[id number]]          Follow Up Instructions: prn    I discussed the assessment and treatment plan with the patient. The  patient was provided an opportunity to ask questions and all were answered. The patient agreed with the plan and demonstrated an understanding of the instructions.   The patient was advised to call back or seek an in-person evaluation if the symptoms worsen or if the condition fails to improve as anticipated.  The above assessment and management plan was discussed with the patient. The patient verbalized understanding of and has agreed to the management plan. Patient is aware to call the clinic if symptoms persist or worsen. Patient is  aware when to return to the clinic for a follow-up visit. Patient educated on when it is appropriate to go to the emergency department.   Time call ended:  12:24  I provided 14 minutes of non-face-to-face time during this encounter.    Mary-Margaret Hassell Done, FNP

## 2021-01-03 ENCOUNTER — Ambulatory Visit (INDEPENDENT_AMBULATORY_CARE_PROVIDER_SITE_OTHER): Payer: 59 | Admitting: Family Medicine

## 2021-01-03 ENCOUNTER — Encounter: Payer: Self-pay | Admitting: Family Medicine

## 2021-01-03 DIAGNOSIS — R195 Other fecal abnormalities: Secondary | ICD-10-CM | POA: Diagnosis not present

## 2021-01-03 NOTE — Progress Notes (Signed)
Subjective:    Patient ID: Victor Best, male    DOB: 1956-07-09, 65 y.o.   MRN: [id number]   HPI: Victor Best is a 65 y.o. male presenting for chronic constipation. Usually has BM q3-4 days. Now, for the last two weeks  he is going daily, small caliber. About the caliber of a pencil, or even smaller.  Overdue for colonoscopy. Denies melena and hematochezia. No pain at abdomen or rectum. Denies diet change except that he has been on low carb diet for 6 mos.    Depression screen [facility] 2/9 09/29/2019  Decreased Interest 0  Down, Depressed, Hopeless 0  PHQ - 2 Score 0  Altered sleeping 0  Tired, decreased energy 0  Change in appetite 0  Feeling bad or failure about yourself  0  Trouble concentrating 0  Moving slowly or fidgety/restless 0  Suicidal thoughts 0  PHQ-9 Score 0     Relevant past medical, surgical, family and social history reviewed and updated as indicated.  Interim medical history since our last visit reviewed. Allergies and medications reviewed and updated.  ROS:  Review of Systems  Constitutional: Negative for chills, diaphoresis, fever and unexpected weight change.  HENT: Negative for rhinorrhea and trouble swallowing.   Respiratory: Negative for cough, chest tightness and shortness of breath.   Cardiovascular: Negative for chest pain.  Gastrointestinal: Positive for constipation (chronic). Negative for abdominal distention, abdominal pain, blood in stool, diarrhea, nausea, rectal pain and vomiting.  Genitourinary: Negative for dysuria, flank pain and hematuria.  Musculoskeletal: Negative for arthralgias and joint swelling.  Skin: Negative for rash.  Neurological: Negative for syncope and headaches.     Social History   Tobacco Use  Smoking Status Never Smoker  Smokeless Tobacco Never Used       Objective:     Wt Readings from Last 3 Encounters:  01/03/20 205 lb 8 oz (93.2 kg)  09/29/19 222 lb (100.7 kg)  11/15/13 211 lb (95.7 kg)     Exam  deferred. Pt. Harboring due to COVID 19. Phone visit performed.   Assessment & Plan:   1. Change in stool caliber     No orders of the defined types were placed in this encounter.   Orders Placed This Encounter  Procedures  . Ambulatory referral to Gastroenterology    Referral Priority:   Routine    Referral Type:   Consultation    Referral Reason:   Specialty Services Required    Number of Visits Requested:   1      Diagnoses and all orders for this visit:  Change in stool caliber -     Ambulatory referral to Gastroenterology    Virtual Visit via telephone Note  I discussed the limitations, risks, security and privacy concerns of performing an evaluation and management service by telephone and the availability of in person appointments. The patient was identified with two identifiers. Pt.expressed understanding and agreed to proceed. Pt. Is at home. Dr. Livia Snellen is in his office.  Follow Up Instructions:   I discussed the assessment and treatment plan with the patient. The patient was provided an opportunity to ask questions and all were answered. The patient agreed with the plan and demonstrated an understanding of the instructions.   The patient was advised to call back or seek an in-person evaluation if the symptoms worsen or if the condition fails to improve as anticipated.   Total minutes including chart review and phone contact time: 42  Follow up plan: Return if symptoms worsen or fail to improve.  Claretta Fraise, MD [place]

## 2021-01-09 ENCOUNTER — Encounter: Payer: Self-pay | Admitting: Gastroenterology

## 2021-02-06 ENCOUNTER — Encounter: Payer: Self-pay | Admitting: Gastroenterology

## 2021-02-06 ENCOUNTER — Ambulatory Visit (INDEPENDENT_AMBULATORY_CARE_PROVIDER_SITE_OTHER): Payer: 59 | Admitting: Gastroenterology

## 2021-02-06 VITALS — BP 120/78 | HR 95 | Ht 70.0 in | Wt 192.0 lb

## 2021-02-06 DIAGNOSIS — R194 Change in bowel habit: Secondary | ICD-10-CM | POA: Diagnosis not present

## 2021-02-06 NOTE — Progress Notes (Signed)
Reviewed and agree with management plans. ? ?Demetric Dunnaway L. Nyair Depaulo, MD, MPH  ?

## 2021-02-06 NOTE — Progress Notes (Signed)
02/06/2021 LONNEL GJERDE [id number] August 14, 1956   HISTORY OF PRESENT ILLNESS: This is a pleasant 65 year old male who is new to our office.  He is here today at the request of his PCP, Dr. Livia Snellen, for evaluation of change in bowel habits.  Patient tells me that he had a colonoscopy probably about 16 years ago.  We do not have records.  He believes it was here in [place].  Nonetheless, he tells me that over the past few months his stools have been smaller in caliber.  He says that he has always been one to be constipated, usually only having a bowel movement every couple of days.  Now he has been having stools more frequently, sometimes several times a day.  No rectal bleeding.  He says that on occasion he will get a stomachache, but no persistent pain, etc.   Past Medical History:  Diagnosis Date   Allergy to alpha-gal    Asthma    Mastoiditis 2004   hospitalized for this. no recurrent issues   Obesity    Past Surgical History:  Procedure Laterality Date   KNEE ARTHROSCOPY Right    TONSILLECTOMY      reports that he has never smoked. He has never used smokeless tobacco. He reports that he does not drink alcohol and does not use drugs. family history includes Asthma in his paternal uncle; Cancer in his father and paternal grandfather; Colon cancer in his paternal uncle; Diabetes in his father; Heart attack (age of onset: 2) in his mother; Heart disease in his mother; Suicidality in his son. No Known Allergies    Outpatient Encounter Medications as of 02/06/2021  Medication Sig   [DISCONTINUED] amoxicillin-clavulanate (AUGMENTIN) 875-125 MG tablet Take 1 tablet by mouth 2 (two) times daily.   [DISCONTINUED] Chlorphen-PE-Acetaminophen 4-10-325 MG TABS Take 1 tablet by mouth every 6 (six) hours as needed.   [DISCONTINUED] fluticasone (FLONASE) 50 MCG/ACT nasal spray Place 2 sprays into both nostrils daily.   [DISCONTINUED] traZODone (DESYREL) 50 MG tablet Take 0.5-2 tablets (25-100  mg total) by mouth at bedtime as needed for sleep.   No facility-administered encounter medications on file as of 02/06/2021.    REVIEW OF SYSTEMS  : All other systems reviewed and negative except where noted in the History of Present Illness.   PHYSICAL EXAM: BP 120/78   Pulse 95   Ht 5\' 10"  (1.778 m)   Wt 192 lb (87.1 kg)   BMI 27.55 kg/m  General: Well developed white male in no acute distress Head: Normocephalic and atraumatic Eyes:  Sclerae anicteric, conjunctiva pink. Ears: Normal auditory acuity Lungs: Clear throughout to auscultation; no W/R/R. Heart: Regular rate and rhythm; no M/R/G. Abdomen: Soft, non-distended.  BS present.  Non-tender. Rectal:  Will be done at the time of colonoscopy. Musculoskeletal: Symmetrical with no gross deformities  Skin: No lesions on visible extremities Extremities: No edema  Neurological: Alert oriented x 4, grossly non-focal Psychological:  Alert and cooperative. Normal mood and affect  ASSESSMENT AND PLAN: *Change in bowel habits:  Last colonoscopy he says probably 16 years ago.  We do not have records.  Says that his bowel habits have changed over the past few months, caliber is much smaller and stools more frequent from what he has always been used to.  Will plan for colonoscopy with Dr. Tarri Glenn later this week.  The risks, benefits, and alternatives to colonoscopy were discussed with the patient and he consents to proceed.   CC:  Janora Norlander, DO CC:  Dr. Livia Snellen

## 2021-02-06 NOTE — Patient Instructions (Signed)
If you are age 63 or older, your body mass index should be between 23-30. Your Body mass index is 27.55 kg/m. If this is out of the aforementioned range listed, please consider follow up with your Primary Care Provider.  If you are age 61 or younger, your body mass index should be between 19-25. Your Body mass index is 27.55 kg/m. If this is out of the aformentioned range listed, please consider follow up with your Primary Care Provider.   You have been scheduled for a colonoscopy. Please follow written instructions given to you at your visit today.  Please pick up your prep supplies at the pharmacy within the next 1-3 days. If you use inhalers (even only as needed), please bring them with you on the day of your procedure.   The [place] [facility] providers would like to encourage you to use [facility] to communicate with providers for non-urgent requests or questions.  Due to long hold times on the telephone, sending your provider a message by [facility] may be a faster and more efficient way to get a response.  Please allow 48 business hours for a response.  Please remember that this is for non-urgent requests.   Due to recent changes in healthcare laws, you may see the results of your imaging and laboratory studies on MyChart before your provider has had a chance to review them.  We understand that in some cases there may be results that are confusing or concerning to you. Not all laboratory results come back in the same time frame and the provider may be waiting for multiple results in order to interpret others.  Please give [place] 48 hours in order for your provider to thoroughly review all the results before contacting the office for clarification of your results.   It was a pleasure to see you today!  Thank you for trusting me with your gastrointestinal care!    Alonza Bogus, PA-C

## 2021-02-08 ENCOUNTER — Encounter: Payer: Self-pay | Admitting: Gastroenterology

## 2021-02-08 ENCOUNTER — Other Ambulatory Visit: Payer: Self-pay

## 2021-02-08 ENCOUNTER — Ambulatory Visit (AMBULATORY_SURGERY_CENTER): Payer: 59 | Admitting: Gastroenterology

## 2021-02-08 VITALS — BP 124/82 | HR 54 | Temp 98.1°F | Resp 12 | Ht 70.0 in | Wt 192.0 lb

## 2021-02-08 DIAGNOSIS — D121 Benign neoplasm of appendix: Secondary | ICD-10-CM

## 2021-02-08 DIAGNOSIS — K635 Polyp of colon: Secondary | ICD-10-CM

## 2021-02-08 DIAGNOSIS — R194 Change in bowel habit: Secondary | ICD-10-CM | POA: Diagnosis not present

## 2021-02-08 DIAGNOSIS — D120 Benign neoplasm of cecum: Secondary | ICD-10-CM

## 2021-02-08 MED ORDER — SODIUM CHLORIDE 0.9 % IV SOLN: 500.0000 mL | Freq: Once | INTRAVENOUS | Status: DC

## 2021-02-08 NOTE — Progress Notes (Signed)
Medical history reviewed with no changes noted. VS assessed by C.W 

## 2021-02-08 NOTE — Patient Instructions (Signed)
Handouts given:  Polyps, hemorrhoids, high fiber diet Continue current medications Start a high fiber diet Await pathology results  YOU HAD AN ENDOSCOPIC PROCEDURE TODAY AT [place]:   Refer to the procedure report that was given to you for any specific questions about what was found during the examination.  If the procedure report does not answer your questions, please call your gastroenterologist to clarify.  If you requested that your care partner not be given the details of your procedure findings, then the procedure report has been included in a sealed envelope for you to review at your convenience later.  YOU SHOULD EXPECT: Some feelings of bloating in the abdomen. Passage of more gas than usual.  Walking can help get rid of the air that was put into your GI tract during the procedure and reduce the bloating. If you had a lower endoscopy (such as a colonoscopy or flexible sigmoidoscopy) you may notice spotting of blood in your stool or on the toilet paper. If you underwent a bowel prep for your procedure, you may not have a normal bowel movement for a few days.  Please Note:  You might notice some irritation and congestion in your nose or some drainage.  This is from the oxygen used during your procedure.  There is no need for concern and it should clear up in a day or so.  SYMPTOMS TO REPORT IMMEDIATELY:  Following lower endoscopy (colonoscopy or flexible sigmoidoscopy):  Excessive amounts of blood in the stool  Significant tenderness or worsening of abdominal pains  Swelling of the abdomen that is new, acute  Fever of 100F or higher  For urgent or emergent issues, a gastroenterologist can be reached at any hour by calling [phone]. Do not use MyChart messaging for urgent concerns.   DIET:  We do recommend a small meal at first, but then you may proceed to your regular diet.  Drink plenty of fluids but you should avoid alcoholic beverages for 24  hours.  ACTIVITY:  You should plan to take it easy for the rest of today and you should NOT DRIVE or use heavy machinery until tomorrow (because of the sedation medicines used during the test).    FOLLOW UP: Our staff will call the number listed on your records 48-72 hours following your procedure to check on you and address any questions or concerns that you may have regarding the information given to you following your procedure. If we do not reach you, we will leave a message.  We will attempt to reach you two times.  During this call, we will ask if you have developed any symptoms of COVID 19. If you develop any symptoms (ie: fever, flu-like symptoms, shortness of breath, cough etc.) before then, please call [phone].  If you test positive for Covid 19 in the 2 weeks post procedure, please call and report this information to [place].    If any biopsies were taken you will be contacted by phone or by letter within the next 1-3 weeks.  Please call us at [phone] if you have not heard about the biopsies in 3 weeks.   SIGNATURES/CONFIDENTIALITY: You and/or your care partner have signed paperwork which will be entered into your electronic medical record.  These signatures attest to the fact that that the information above on your After Visit Summary has been reviewed and is understood.  Full responsibility of the confidentiality of this discharge information lies with you and/or your care-partner.

## 2021-02-08 NOTE — Progress Notes (Signed)
To pacu, VSS. Report to [email or web address] 

## 2021-02-08 NOTE — Op Note (Signed)
[facility] Patient Name: Victor Best Procedure Date: 02/08/2021 3:08 PM MRN: [id number] Endoscopist: Thornton Park MD, MD Age: 65 Referring MD:  Date of Birth: 1956-05-06 Gender: Male Account #: [id number] Procedure:                Colonoscopy Indications:              Change in bowel habits                           Last colonoscopy 16 years ago Medicines:                Monitored Anesthesia Care Procedure:                Pre-Anesthesia Assessment:                           - Prior to the procedure, a History and Physical                            was performed, and patient medications and                            allergies were reviewed. The patient's tolerance of                            previous anesthesia was also reviewed. The risks                            and benefits of the procedure and the sedation                            options and risks were discussed with the patient.                            All questions were answered, and informed consent                            was obtained. Prior Anticoagulants: The patient has                            taken no previous anticoagulant or antiplatelet                            agents. ASA Grade Assessment: II - A patient with                            mild systemic disease. After reviewing the risks                            and benefits, the patient was deemed in                            satisfactory condition to undergo the procedure.  After obtaining informed consent, the colonoscope                            was passed under direct vision. Throughout the                            procedure, the patient's blood pressure, pulse, and                            oxygen saturations were monitored continuously. The                            Olympus CF-HQ190L (Serial# [id number]) Colonoscope was                            introduced through the anus and advanced to the the                             terminal ileum. A second forward view of the right                            colon was performed. The colonoscopy was performed                            without difficulty. The patient tolerated the                            procedure well. The quality of the bowel                            preparation was good. The terminal ileum, ileocecal                            valve, appendiceal orifice, and rectum were                            photographed. Scope In: 3:16:34 PM Scope Out: 3:33:36 PM Scope Withdrawal Time: 0 hours 13 minutes 50 seconds  Total Procedure Duration: 0 hours 17 minutes 2 seconds  Findings:                 The perianal and digital rectal examinations were                            normal.                           Non-bleeding internal hemorrhoids were found. The                            hemorrhoids were small.                           A 2 mm polyp was found in the ascending colon. The  polyp was sessile. The polyp was removed with a                            cold snare. Resection and retrieval were complete.                            Estimated blood loss was minimal.                           A less than 1 mm polyp was found in the appendiceal                            orifice. The polyp was flat. The polyp was removed                            with a piecemeal technique using a cold biopsy                            forceps. Resection and retrieval were complete.                            Estimated blood loss was minimal.                           The exam was otherwise without abnormality on                            direct and retroflexion views. Complications:            No immediate complications. Estimated blood loss:                            Minimal. Estimated Blood Loss:     Estimated blood loss was minimal. Impression:               - Non-bleeding internal hemorrhoids.                            - One 2 mm polyp in the ascending colon, removed                            with a cold snare. Resected and retrieved.                           - One less than 1 mm polyp at the appendiceal                            orifice, removed piecemeal using a cold biopsy                            forceps. Resected and retrieved.                           - The examination was otherwise normal on direct  and retroflexion views. Recommendation:           - Patient has a contact number available for                            emergencies. The signs and symptoms of potential                            delayed complications were discussed with the                            patient. Return to normal activities tomorrow.                            Written discharge instructions were provided to the                            patient.                           - High fiber diet. Recommend adding a daily dose of                            psyllium or methylcellulose.                           - Continue present medications.                           - Repeat colonoscopy date to be determined after                            pending pathology results are reviewed for                            surveillance.                           - Emerging evidence supports eating a diet of                            fruits, vegetables, grains, calcium, and yogurt                            while reducing red meat and alcohol may reduce the                            risk of colon cancer.                           - Thank you for allowing me to be involved in your                            colon cancer prevention. Thornton Park MD, MD 02/08/2021 3:38:39 PM This report has been signed electronically.

## 2021-02-08 NOTE — Progress Notes (Signed)
Called to room to assist during endoscopic procedure.  Patient ID and intended procedure confirmed with present staff. Received instructions for my participation in the procedure from the performing physician.  

## 2021-02-12 ENCOUNTER — Telehealth: Payer: Self-pay

## 2021-02-12 NOTE — Telephone Encounter (Signed)
Unable to leave message- line busy.

## 2021-02-21 ENCOUNTER — Encounter: Payer: Self-pay | Admitting: Gastroenterology

## 2021-02-23 ENCOUNTER — Other Ambulatory Visit: Payer: Self-pay

## 2021-02-23 ENCOUNTER — Ambulatory Visit (INDEPENDENT_AMBULATORY_CARE_PROVIDER_SITE_OTHER): Payer: 59 | Admitting: Nurse Practitioner

## 2021-02-23 ENCOUNTER — Encounter: Payer: Self-pay | Admitting: Nurse Practitioner

## 2021-02-23 VITALS — BP 123/80 | HR 64 | Temp 97.8°F | Ht 70.0 in | Wt 192.0 lb

## 2021-02-23 DIAGNOSIS — R140 Abdominal distension (gaseous): Secondary | ICD-10-CM

## 2021-02-23 MED ORDER — SIMETHICONE 40 MG/0.6ML PO SUSP: 40.0000 mg | Freq: Four times a day (QID) | ORAL | 0 refills | Status: DC | PRN

## 2021-02-23 NOTE — Patient Instructions (Signed)
Colonoscopy, Adult, Care After This sheet gives you information about how to care for yourself after your procedure. Your health care provider may also give you more specific instructions. If you have problems or questions, contact your health careprovider. What can I expect after the procedure? After the procedure, it is common to have: A small amount of blood in your stool for 24 hours after the procedure. Some gas. Mild cramping or bloating of your abdomen. Follow these instructions at home: Eating and drinking  Drink enough fluid to keep your urine pale yellow. Follow instructions from your health care provider about eating or drinking restrictions. Resume your normal diet as instructed by your health care provider. Avoid heavy or fried foods that are hard to digest.  Activity Rest as told by your health care provider. Avoid sitting for a long time without moving. Get up to take short walks every 1-2 hours. This is important to improve blood flow and breathing. Ask for help if you feel weak or unsteady. Return to your normal activities as told by your health care provider. Ask your health care provider what activities are safe for you. Managing cramping and bloating  Try walking around when you have cramps or feel bloated. Apply heat to your abdomen as told by your health care provider. Use the heat source that your health care provider recommends, such as a moist heat pack or a heating pad. Place a towel between your skin and the heat source. Leave the heat on for 20-30 minutes. Remove the heat if your skin turns bright red. This is especially important if you are unable to feel pain, heat, or cold. You may have a greater risk of getting burned.  General instructions If you were given a sedative during the procedure, it can affect you for several hours. Do not drive or operate machinery until your health care provider says that it is safe. For the first 24 hours after the  procedure: Do not sign important documents. Do not drink alcohol. Do your regular daily activities at a slower pace than normal. Eat soft foods that are easy to digest. Take over-the-counter and prescription medicines only as told by your health care provider. Keep all follow-up visits as told by your health care provider. This is important. Contact a health care provider if: You have blood in your stool 2-3 days after the procedure. Get help right away if you have: More than a small spotting of blood in your stool. Large blood clots in your stool. Swelling of your abdomen. Nausea or vomiting. A fever. Increasing pain in your abdomen that is not relieved with medicine. Summary After the procedure, it is common to have a small amount of blood in your stool. You may also have mild cramping and bloating of your abdomen. If you were given a sedative during the procedure, it can affect you for several hours. Do not drive or operate machinery until your health care provider says that it is safe. Get help right away if you have a lot of blood in your stool, nausea or vomiting, a fever, or increased pain in your abdomen. This information is not intended to replace advice given to you by your health care provider. Make sure you discuss any questions you have with your healthcare provider. Document Revised: 07/30/2019 Document Reviewed: 03/01/2019 Elsevier Patient Education  [place].

## 2021-02-23 NOTE — Assessment & Plan Note (Signed)
Patient recently completed colonoscopy 2 weeks ago.  Patient was fine until 3 days ago when he started experiencing fullness, tightness and discomfort. Patient reports stool has no color.  Given that patient recently completed colonoscopy I will treat patient with gas relief, I provided education on side effects of colonoscopy.  Advised patient to follow-up with signs of fever, bleeding, and overall worsening symptoms.  With unresolved symptoms assessment of pancreas, gallbladder and liver may be assessed for production of bile.  Rx sent to pharmacy

## 2021-02-23 NOTE — Progress Notes (Signed)
Acute Office Visit  Subjective:    Patient ID: Victor Best, male    DOB: [birth year], 65 y.o.   MRN: [id number]  Chief Complaint  Patient presents with   Abdominal Pain    Abdominal Pain This is a new problem. Episode onset: In the past 3 days. The onset quality is gradual. The problem occurs intermittently. The problem has been unchanged. The pain is located in the generalized abdominal region. The pain is moderate. The quality of the pain is aching and a sensation of fullness. The abdominal pain does not radiate. Pertinent negatives include no constipation, fever, nausea or vomiting. Exacerbated by: Recent colonoscopy 2 weeks ago. The pain is relieved by Nothing. He has tried nothing for the symptoms.    Past Medical History:  Diagnosis Date   Allergy to alpha-gal    Asthma    Mastoiditis 2004   hospitalized for this. no recurrent issues   Obesity     Past Surgical History:  Procedure Laterality Date   KNEE ARTHROSCOPY Right    TONSILLECTOMY      Family History  Problem Relation Age of Onset   Heart disease Mother    Heart attack Mother 34       cause of death   Diabetes Father    Cancer Father        brain tumor   Colon cancer Paternal Uncle    Asthma Paternal Uncle    Cancer Paternal Grandfather        ? 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  He is married to [place] and they reside here in  [place].     They have 1 son who sadly passed away after being involved in a motorcycle accident in 2020.     They have 2 grandchildren that are teenagers whom they enjoy spending time with.   Social Determinants of Health   Financial Resource Strain: Not on file  Food Insecurity: Not on file  Transportation Needs: Not on file  Physical Activity: Not on file  Stress: Not on file  Social Connections: Not on file  Intimate Partner Violence: Not on file    No outpatient medications prior to visit.   No facility-administered medications prior to visit.    No Known Allergies  Review of Systems  Constitutional: Negative.  Negative for fever.  HENT: Negative.    Respiratory: Negative.    Gastrointestinal:  Positive for abdominal pain. Negative for constipation, nausea and vomiting.  Genitourinary: Negative.   Skin:  Negative for rash.  All other systems reviewed and are negative.     Objective:    Physical Exam Vitals and nursing note reviewed. Exam conducted with a chaperone present (Spouse).  Constitutional:      Appearance: Normal appearance. He is well-developed.  HENT:  Head: Normocephalic.     Nose: Nose normal.  Eyes:     Conjunctiva/sclera: Conjunctivae normal.  Cardiovascular:     Rate and Rhythm: Normal rate and regular rhythm.     Heart sounds: Normal heart sounds.  Pulmonary:     Effort: Pulmonary effort is normal.     Breath sounds: Normal breath sounds.  Abdominal:     General: Bowel sounds are normal.     Tenderness: There is abdominal tenderness. There is no right CVA tenderness or left CVA tenderness.  Skin:    Findings: No rash.  Neurological:     Mental Status: He is alert and oriented to person, place, and time.  Psychiatric:        Behavior: Behavior normal.    BP 123/80   Pulse 64   Temp 97.8 F (36.6 C) (Temporal)   Ht 5\' 10"  (1.778 m)   Wt 192 lb (87.1 kg)   SpO2 98%   BMI 27.55 kg/m  Wt Readings from Last 3 Encounters:   02/23/21 192 lb (87.1 kg)  02/08/21 192 lb (87.1 kg)  02/06/21 192 lb (87.1 kg)    Health Maintenance Due  Topic Date Due   COVID-19 Vaccine (1) Never done   TETANUS/TDAP  Never done   Zoster Vaccines- Shingrix (1 of 2) Never done    There are no preventive care reminders to display for this patient.   Lab Results  Component Value Date   TSH 1.690 10/01/2019   Lab Results  Component Value Date   WBC 6.0 10/01/2019   HGB 16.0 10/01/2019   HCT 46.6 10/01/2019   MCV 89 10/01/2019   PLT 224 10/01/2019   Lab Results  Component Value Date   NA 142 10/01/2019   K 4.5 10/01/2019   CO2 24 10/01/2019   GLUCOSE 84 10/01/2019   BUN 17 10/01/2019   CREATININE 1.00 10/01/2019   BILITOT 0.4 10/01/2019   ALKPHOS 70 10/01/2019   AST 17 10/01/2019   ALT 20 10/01/2019   PROT 7.0 10/01/2019   ALBUMIN 4.3 10/01/2019   CALCIUM 9.4 10/01/2019   Lab Results  Component Value Date   CHOL 264 (H) 10/01/2019   Lab Results  Component Value Date   HDL 45 10/01/2019   Lab Results  Component Value Date   LDLCALC 187 (H) 10/01/2019   Lab Results  Component Value Date   TRIG 169 (H) 10/01/2019   Lab Results  Component Value Date   CHOLHDL 5.9 (H) 10/01/2019   No results found for: HGBA1C     Assessment & Plan:   Problem List Items Addressed This Visit       Other   Bloating - Primary    Patient recently completed colonoscopy 2 weeks ago.  Patient was fine until 3 days ago when he started experiencing fullness, tightness and discomfort. Patient reports stool has no color.  Given that patient recently completed colonoscopy I will treat patient with gas relief, I provided education on side effects of colonoscopy.  Advised patient to follow-up with signs of fever, bleeding, and overall worsening symptoms.  With unresolved symptoms assessment of pancreas, gallbladder and liver may be assessed for production of bile.  Rx sent to pharmacy       Relevant Medications    simethicone (GAS RELIEF) 40 MG/0.6ML drops     Meds ordered this encounter  Medications   simethicone (GAS RELIEF) 40 MG/0.6ML drops    Sig: Take 0.6 mLs (40 mg total) by  mouth 4 (four) times daily as needed for flatulence.    Dispense:  30 mL    Refill:  0    Order Specific Question:   Supervising Provider    Answer:   Janora Norlander [[id number]]     Ivy Lynn, NP

## 2021-03-02 ENCOUNTER — Encounter: Payer: Self-pay | Admitting: Family Medicine

## 2021-03-02 ENCOUNTER — Ambulatory Visit (INDEPENDENT_AMBULATORY_CARE_PROVIDER_SITE_OTHER): Payer: 59 | Admitting: Family Medicine

## 2021-03-02 ENCOUNTER — Other Ambulatory Visit: Payer: Self-pay

## 2021-03-02 VITALS — BP 111/76 | HR 53 | Temp 97.8°F | Ht 70.0 in | Wt 191.8 lb

## 2021-03-02 DIAGNOSIS — R140 Abdominal distension (gaseous): Secondary | ICD-10-CM | POA: Diagnosis not present

## 2021-03-02 DIAGNOSIS — Z125 Encounter for screening for malignant neoplasm of prostate: Secondary | ICD-10-CM

## 2021-03-02 DIAGNOSIS — E7800 Pure hypercholesterolemia, unspecified: Secondary | ICD-10-CM

## 2021-03-02 DIAGNOSIS — R195 Other fecal abnormalities: Secondary | ICD-10-CM

## 2021-03-02 MED ORDER — POLYETHYLENE GLYCOL 3350 17 GM/SCOOP PO POWD: ORAL | 99 refills | Status: DC

## 2021-03-02 NOTE — Patient Instructions (Signed)
Thank you for coming in to clinic today.  1. Your symptoms are consistent with Constipation, likely cause of your General Abdominal Pain / Cramping. 2. Start with Miralax sent to pharmacy. First dose 68g (4 capfuls) in 32oz water over 1 to 2 hours for clean out. Next day start 17g or 1 capful daily, may adjust dose up or down by half a capful every few days. Recommend to take this medicine daily for next 1-2 weeks, then may need to use it longer if needed. - Goal is to have soft regular bowel movement 1-3x daily, if too runny or diarrhea, then reduce dose of the medicine  Improve water intake, hydration will help Also recommend increased vegetables, fruits, fiber intake Can try daily Metamucil or Fiber supplement at pharmacy over the counter  Follow-up if symptoms are not improving with bowel movements, or if pain worsens, develop fevers, nausea, vomiting.  If you have any other questions or concerns, please feel free to call the clinic to contact me. You may also schedule an earlier appointment if necessary.  However, if your symptoms get significantly worse, please go to the Emergency Department to seek immediate medical attention.

## 2021-03-02 NOTE — Progress Notes (Signed)
Subjective: CC: Abdominal issues PCP: Janora Norlander, DO GNO:IBBCWU B Victor Best is a 65 y.o. male presenting to clinic today for:  1.  Change in bowel habits Patient reports that he has had some bloating and change in the caliber of his stools over the last several months.  He had colonoscopy which showed only 1 precancerous polyp but really no other acute findings.  He notes that he previously used to have a very good bowel movement roughly every 4 days and now he just has a very tiny bowel movement, which appears straw sized, once daily.  There has been no change in his diet, hydration or activity.  He is using Ex-Lax here and there and this did help but he did not want to be dependent on anything.  He admits to belching a lot.  Simethicone has helped some.  He is also used Metamucil, increase his fiber (prunes, fruits and vegetables).  He admits that he does not drink a lot of water but usually drinks half-and-half tea.  He denies any rectal bleeding.  No change in voice, no tremors.  He has had no change in urine output, urine frequency.  No new back pain or pelvic pain.  He is fasting today would like to have the full labs performed   ROS: Per HPI  No Known Allergies Past Medical History:  Diagnosis Date   Allergy to alpha-gal    Asthma    Mastoiditis 2004   hospitalized for this. no recurrent issues   Obesity     Current Outpatient Medications:    simethicone (GAS RELIEF) 40 MG/0.6ML drops, Take 0.6 mLs (40 mg total) by mouth 4 (four) times daily as needed for flatulence., Disp: 30 mL, Rfl: 0 Social History   Socioeconomic History   Marital status: Married    Spouse name: Lattie Haw   Number of children: 1   Years of education: Not on file   Highest education level: Not on file  Occupational History   Occupation: Furniture conservator/restorer  Tobacco Use   Smoking status: Never   Smokeless tobacco: Never  Vaping Use   Vaping Use: Never used  Substance and Sexual Activity   Alcohol use: No    Drug use: No   Sexual activity: Not on file  Other Topics Concern   Not on file  Social History Narrative   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  He is married to [place] and they reside here in [place].     They have 1 son who sadly passed away after being involved in a motorcycle accident in 2020.     They have 2 grandchildren that are teenagers whom they enjoy spending time with.   Social Determinants of Health   Financial Resource Strain: Not on file  Food Insecurity: Not on file  Transportation Needs: Not on file  Physical Activity: Not on file  Stress: Not on file  Social Connections: Not on file  Intimate Partner Violence: Not on file   Family History  Problem Relation Age of Onset   Heart disease Mother    Heart attack Mother 34       cause of death   Diabetes Father    Cancer Father        brain tumor   Colon cancer Paternal Uncle    Asthma Paternal Uncle    Cancer Paternal Grandfather        ? type   Suicidality Son  Objective: Office vital signs reviewed. BP 111/76   Pulse (!) 53   Temp 97.8 F (36.6 C)   Ht _0  (1.778 m)   Wt 191 lb 12.8 oz (87 kg)   SpO2 98%   BMI 27.52 kg/m   Physical Examination:  General: Awake, alert, well nourished, No acute distress HEENT: Normal; no exophthalmos.  No lymphadenopathy.  No palpable thyroid masses or nodules Cardio: regular rate and rhythm, S1S2 heard, no murmurs appreciated Pulm: clear to auscultation bilaterally, no wheezes, rhonchi or rales; normal work of breathing on room air GI: soft, non-tender, non-distended, bowel sounds present x4, no hepatomegaly, no splenomegaly, no masses Neuro: No tremor Skin: Normal temperature  Assessment/ Plan: 65 y.o. male   Change in stool caliber - Plan: CMP14+EGFR, CBC, TSH, T4, Free, polyethylene glycol powder (MIRALAX) 17 GM/SCOOP powder  Bloating - Plan: CMP14+EGFR, CBC, TSH, T4, Free, polyethylene glycol powder  (MIRALAX) 17 GM/SCOOP powder  Elevated cholesterol - Plan: Lipid Panel  Screening for malignant neoplasm of prostate - Plan: PSA  Uncertain etiology of change in bowel habits.  We discussed some naturopathic remedies to promote bowel movements including psyllium fiber.  It sounds like he is really maximized changes in diet with the exception of increased water which I do encourage.  For the time being we will treat him with MiraLAX.  Discussed both cleanout and maintenance regimen.  We will look for metabolic etiology of symptoms as well as collect a few other labs whilst we were collecting diagnostic labs.  No orders of the defined types were placed in this encounter.  No orders of the defined types were placed in this encounter.    Janora Norlander, DO [place] [phone]

## 2021-03-03 LAB — CMP14+EGFR
ALT: 13 IU/L (ref 0–44)
AST: 13 IU/L (ref 0–40)
Albumin/Globulin Ratio: 1.6 (ref 1.2–2.2)
Albumin: 4.2 g/dL (ref 3.8–4.8)
Alkaline Phosphatase: 75 IU/L (ref 44–121)
BUN/Creatinine Ratio: 15 (ref 10–24)
BUN: 15 mg/dL (ref 8–27)
Bilirubin Total: 0.5 mg/dL (ref 0.0–1.2)
CO2: 26 mmol/L (ref 20–29)
Calcium: 9.2 mg/dL (ref 8.6–10.2)
Chloride: 100 mmol/L (ref 96–106)
Creatinine, Ser: 1.03 mg/dL (ref 0.76–1.27)
Globulin, Total: 2.6 g/dL (ref 1.5–4.5)
Glucose: 88 mg/dL (ref 65–99)
Potassium: 4.6 mmol/L (ref 3.5–5.2)
Sodium: 139 mmol/L (ref 134–144)
Total Protein: 6.8 g/dL (ref 6.0–8.5)
eGFR: 82 mL/min/{1.73_m2} (ref >59)

## 2021-03-03 LAB — LIPID PANEL
Chol/HDL Ratio: 4.8 ratio (ref 0.0–5.0)
Cholesterol, Total: 209 mg/dL — ABNORMAL HIGH (ref 100–199)
HDL: 44 mg/dL (ref >39)
LDL Chol Calc (NIH): 154 mg/dL — ABNORMAL HIGH (ref 0–99)
Triglycerides: 63 mg/dL (ref 0–149)
VLDL Cholesterol Cal: 11 mg/dL (ref 5–40)

## 2021-03-03 LAB — CBC
Hematocrit: 44.9 % (ref 37.5–51.0)
Hemoglobin: 15.2 g/dL (ref 13.0–17.7)
MCH: 30.4 pg (ref 26.6–33.0)
MCHC: 33.9 g/dL (ref 31.5–35.7)
MCV: 90 fL (ref 79–97)
Platelets: 185 10*3/uL (ref 150–450)
RBC: 5 x10E6/uL (ref 4.14–5.80)
RDW: 12.8 % (ref 11.6–15.4)
WBC: 6.2 10*3/uL (ref 3.4–10.8)

## 2021-03-03 LAB — TSH: TSH: 1.47 u[IU]/mL (ref 0.450–4.500)

## 2021-03-03 LAB — T4, FREE: Free T4: 1.36 ng/dL (ref 0.82–1.77)

## 2021-03-03 LAB — PSA: Prostate Specific Ag, Serum: 1.6 ng/mL (ref 0.0–4.0)

## 2021-03-06 NOTE — Progress Notes (Signed)
R/c about labs

## 2021-03-30 ENCOUNTER — Encounter: Payer: Self-pay | Admitting: Gastroenterology

## 2021-03-30 ENCOUNTER — Ambulatory Visit (INDEPENDENT_AMBULATORY_CARE_PROVIDER_SITE_OTHER): Payer: 59 | Admitting: Gastroenterology

## 2021-03-30 VITALS — BP 100/60 | HR 70 | Ht 70.0 in | Wt 197.0 lb

## 2021-03-30 DIAGNOSIS — R195 Other fecal abnormalities: Secondary | ICD-10-CM

## 2021-03-30 DIAGNOSIS — R194 Change in bowel habit: Secondary | ICD-10-CM | POA: Diagnosis not present

## 2021-03-30 MED ORDER — DICYCLOMINE HCL 20 MG PO TABS: 20.0000 mg | ORAL_TABLET | Freq: Four times a day (QID) | ORAL | 2 refills | Status: DC

## 2021-03-30 NOTE — Progress Notes (Signed)
Referring Provider: Janora Norlander, DO Primary Care Physician:  Janora Norlander, DO  Chief complaint:  Altered bowel habits   IMPRESSION:  Altered bowel habits with change in stool caliber and increase in mucous not explained by colonoscopy  Given the results of his recent colonoscopy, Irritable bowel syndrome is possible, although mucous in the stools can be normal for some people. Must consider food allergies. Colonoscopy excludes segmental colitis associated with diverticulosis, inflammatory bowel disease, and mucous secreting appendiceal tumor.   PLAN: 2 way of the abdomen to evaluate for overflow Dicyclomine 20 mg QID prior to meals for symptomatic relief Continue Metamucil/Miralax Discussed Smooth Moove, diets that treat constipation Consider trial of low FODMAP diet Consider CT abd/pelvis with any development of any alarm features Follow-up in 3 months, earlier as needed   Please see the "Patient Instructions" section for addition details about the plan.  HPI: Victor Best is a 65 y.o. male who returns in follow-up. This is my first office visit with Victor Best. I performed his colonoscopy in June.  His wife accompanies him to this appointment and contributes to the history. He has a history of Alpha Gal, asthma, and obesity.   Since childhood, would have a bowel movement every 3-4 days. 6 months ago started having a small, straw sized daily bowel movement. Associated gas/eructation and bloating. Rare abdominal pain.   Colonoscopy 01/2021 did not provide an answer. A small tubular adenoma was removed from the appendiceal orifice. CMP, CBC, TSH have been normal  In fact, symptoms actually worsened after his colonoscopy. Now with mucous most mornings. Recently passed a very strange bowel movement. Since then, things are more normal with a BM every 3 days.   3 hours after eating he has abdominal fullness. No nausea.   Mild improvement with GasEx, Metamucil daily,  Miralax x 1 this week Increased orange, banana, prunes, and yogurt with no significant improvement.     Past Medical History:  Diagnosis Date   Allergy to alpha-gal    Asthma    Mastoiditis 2004   hospitalized for this. no recurrent issues   Obesity     Past Surgical History:  Procedure Laterality Date   KNEE ARTHROSCOPY Right    TONSILLECTOMY      Current Outpatient Medications  Medication Sig Dispense Refill   dicyclomine (BENTYL) 20 MG tablet Take 1 tablet (20 mg total) by mouth every 6 (six) hours. 120 tablet 2   polyethylene glycol powder (MIRALAX) 17 GM/SCOOP powder Mix 1 capful in 8 ounces of water and drink 1-2 times daily as needed for constipation 255 g PRN   simethicone (GAS RELIEF) 40 MG/0.6ML drops Take 0.6 mLs (40 mg total) by mouth 4 (four) times daily as needed for flatulence. 30 mL 0   No current facility-administered medications for this visit.    Allergies as of 03/30/2021   (No Known Allergies)    Family History  Problem Relation Age of Onset   Heart disease Mother    Heart attack Mother 63       cause of death   Diabetes Father    Cancer Father        brain tumor   Colon cancer Paternal Uncle    Asthma Paternal Uncle    Cancer Paternal Grandfather        ? type   Suicidality Son       Physical Exam: General:   Alert,  well-nourished, pleasant and cooperative in NAD Head:  Normocephalic  and atraumatic. Eyes:  Sclera clear, no icterus.   Conjunctiva pink. Ears:  Normal auditory acuity. Nose:  No deformity, discharge,  or lesions. Mouth:  No deformity or lesions.   Neck:  Supple; no masses or thyromegaly. Lungs:  Clear throughout to auscultation.   No wheezes. Heart:  Regular rate and rhythm; no murmurs. Abdomen:  Soft,nontender, nondistended, normal bowel sounds, no rebound or guarding. No hepatosplenomegaly.   Rectal:  Deferred  Msk:  Symmetrical. No boney deformities LAD: No inguinal or umbilical LAD Extremities:  No clubbing or  edema. Neurologic:  Alert and  oriented x4;  grossly nonfocal Skin:  Intact without significant lesions or rashes. Psych:  Alert and cooperative. Normal mood and affect.    Lejuan Botto L. Tarri Glenn, MD, MPH 03/30/2021, 5:31 PM

## 2021-03-30 NOTE — Patient Instructions (Addendum)
If you are age 63 or older, your body mass index should be between 23-30. Your Body mass index is 28.27 kg/m. If this is out of the aforementioned range listed, please consider follow up with your Primary Care Provider.  If you are age 57 or younger, your body mass index should be between 19-25. Your Body mass index is 28.27 kg/m. If this is out of the aformentioned range listed, please consider follow up with your Primary Care Provider.   __________________________________________________________  The [facility] providers would like to encourage you to use [facility] to communicate with providers for non-urgent requests or questions.  Due to long hold times on the telephone, sending your provider a message by [facility] may be a faster and more efficient way to get a response.  Please allow 48 business hours for a response.  Please remember that this is for non-urgent requests.   Your provider has requested that you go to the basement level for x- ray work before leaving today. Press "B" on the elevator. The lab is located at the first door on the left as you exit the elevator.    Follow up in 3 months   Thank you for trusting me with your gastrointestinal care!    Thornton Park, MD, MPH

## 2021-04-16 ENCOUNTER — Ambulatory Visit (INDEPENDENT_AMBULATORY_CARE_PROVIDER_SITE_OTHER)
Admission: RE | Admit: 2021-04-16 | Discharge: 2021-04-16 | Disposition: A | Discharge status: Home / Self Care | Payer: 59 | Source: Ambulatory Visit | Attending: Gastroenterology | Admitting: Gastroenterology

## 2021-04-16 ENCOUNTER — Other Ambulatory Visit: Payer: Self-pay

## 2021-04-16 DIAGNOSIS — R194 Change in bowel habit: Secondary | ICD-10-CM

## 2021-04-17 ENCOUNTER — Other Ambulatory Visit: Payer: Self-pay

## 2021-04-17 ENCOUNTER — Telehealth: Payer: Self-pay | Admitting: Gastroenterology

## 2021-04-17 MED ORDER — LINACLOTIDE 145 MCG PO CAPS: 145.0000 ug | ORAL_CAPSULE | Freq: Every day | ORAL | 3 refills | Status: DC

## 2021-04-17 NOTE — Telephone Encounter (Signed)
Pt called stating that he was returning Dr. Tarri Glenn' call but I saw that you called him. Pls call him again.

## 2021-04-18 NOTE — Telephone Encounter (Signed)
See result note.  

## 2021-05-29 ENCOUNTER — Ambulatory Visit (INDEPENDENT_AMBULATORY_CARE_PROVIDER_SITE_OTHER): Payer: 59 | Admitting: Family Medicine

## 2021-05-29 ENCOUNTER — Encounter: Payer: Self-pay | Admitting: Family Medicine

## 2021-05-29 ENCOUNTER — Other Ambulatory Visit: Payer: Self-pay

## 2021-05-29 VITALS — BP 128/78 | HR 87 | Temp 97.2°F | Ht 70.0 in | Wt 189.6 lb

## 2021-05-29 DIAGNOSIS — M25512 Pain in left shoulder: Secondary | ICD-10-CM

## 2021-05-29 DIAGNOSIS — K5909 Other constipation: Secondary | ICD-10-CM

## 2021-05-29 DIAGNOSIS — G8929 Other chronic pain: Secondary | ICD-10-CM

## 2021-05-29 NOTE — Progress Notes (Signed)
Subjective: CC: Chronic left shoulder pain PCP: Janora Norlander, DO Victor Best is a 65 y.o. male presenting to clinic today for:  1.  Chronic left shoulder pain Patient points to the deltoid aspect of the left shoulder as the area of pain.  He has had this for many years but does not require any specific inciting injury.  He notes that pain is worse when he tries to reach behind his back.  He is right-hand dominant.  He denies any sensory changes, electric type pain down the left upper extremity.  He has never had a formal evaluation of the shoulder with imaging or specialty evaluation but was prescribed Voltaren gel once which she did not find effective.  He notes that seemingly the pain is becoming progressive and that now he wakes up easily at nighttime from the pain.  Pain is exacerbated by certain movements and recently was notable while carrying a tray in his left hand.  He feels that he is getting some weakness in that left upper extremity.   ROS: Per HPI  No Known Allergies Past Medical History:  Diagnosis Date   Allergy to alpha-gal    Asthma    Mastoiditis 2004   hospitalized for this. no recurrent issues   Obesity     Current Outpatient Medications:    dicyclomine (BENTYL) 20 MG tablet, Take 1 tablet (20 mg total) by mouth every 6 (six) hours., Disp: 120 tablet, Rfl: 2   linaclotide (LINZESS) 145 MCG CAPS capsule, Take 1 capsule (145 mcg total) by mouth daily before breakfast., Disp: 30 capsule, Rfl: 3   polyethylene glycol powder (MIRALAX) 17 GM/SCOOP powder, Mix 1 capful in 8 ounces of water and drink 1-2 times daily as needed for constipation, Disp: 255 g, Rfl: PRN   simethicone (GAS RELIEF) 40 MG/0.6ML drops, Take 0.6 mLs (40 mg total) by mouth 4 (four) times daily as needed for flatulence., Disp: 30 mL, Rfl: 0 Social History   Socioeconomic History   Marital status: Married    Spouse name: Victor Best   Number of children: 1   Years of education: Not on file    Highest education level: Not on file  Occupational History   Occupation: Furniture conservator/restorer  Tobacco Use   Smoking status: Never   Smokeless tobacco: Never  Vaping Use   Vaping Use: Never used  Substance and Sexual Activity   Alcohol use: No   Drug use: No   Sexual activity: Not on file  Other Topics Concern   Not on file  Social History Narrative   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  He is married to Victor Best and they reside here in [place].     They have 1 son who sadly passed away after being involved in a motorcycle accident in 2020.     They have 2 grandchildren that are teenagers whom they enjoy spending time with.   Social Determinants of Health   Financial Resource Strain: Not on file  Food Insecurity: Not on file  Transportation Needs: Not on file  Physical Activity: Not on file  Stress: Not on file  Social Connections: Not on file  Intimate Partner Violence: Not on file   Family History  Problem Relation Age of Onset   Heart disease Mother    Heart attack Mother 41       cause of death   Diabetes Father    Cancer Father  brain tumor   Colon cancer Paternal Uncle    Asthma Paternal Uncle    Cancer Paternal Grandfather        ? type   Suicidality Son     Objective: Office vital signs reviewed. BP 128/78   Pulse 87   Temp (!) 97.2 F (36.2 C)   Ht 5\' 10"  (1.778 m)   Wt 189 lb 9.6 oz (86 kg)   SpO2 94%   BMI 27.20 kg/m   Physical Examination:  General: Awake, alert, well nourished, No acute distress MSK:   Left shoulder: No palpable deformities.  No tenderness palpation to the shoulder, deltoid or triceps.  He has limited active range of motion in internal rotation of the shoulder.  Mild pain with resisted full can testing but negative empty can.  Mild pain reproduced with Hawkins maneuver Neuro: 5/5 UE Strength and light touch sensation grossly intact  Assessment/ Plan: 65 y.o. male   Chronic left shoulder  pain - Plan: Ambulatory referral to Sports Medicine  Chronic constipation  Uncertain etiology.  Possibly impingement versus tendinopathy but this was minimally reproducible on exam and only present with full can and Hawkins exam.  There were no palpable deformities on exam.  His strength was intact.  I am going to place a referral to sports medicine in [facility] for consideration of evaluation of the shoulder under ultrasound.  For his chronic constipation, it appears that his specialist has sent in a prescription for Linzess but he denies having this medicine.  Have given him samples today to try out.   No orders of the defined types were placed in this encounter.  No orders of the defined types were placed in this encounter.    Janora Norlander, DO [place] [phone]

## 2021-06-04 ENCOUNTER — Ambulatory Visit
Admission: RE | Admit: 2021-06-04 | Discharge: 2021-06-04 | Disposition: A | Discharge status: Home / Self Care | Payer: 59 | Source: Ambulatory Visit | Attending: Sports Medicine | Admitting: Sports Medicine

## 2021-06-04 ENCOUNTER — Ambulatory Visit (INDEPENDENT_AMBULATORY_CARE_PROVIDER_SITE_OTHER): Payer: 59 | Admitting: Sports Medicine

## 2021-06-04 VITALS — BP 118/70 | Ht 70.0 in | Wt 189.0 lb

## 2021-06-04 DIAGNOSIS — M25512 Pain in left shoulder: Secondary | ICD-10-CM

## 2021-06-04 NOTE — Progress Notes (Signed)
   Subjective:    Patient ID: Victor Best, male    DOB: September 23, 1955, 65 y.o.   MRN: [id number]  HPI chief complaint: Left shoulder pain  Victor Best is a very pleasant right-hand-dominant 65 year old male that comes in today complaining of 2 years of worsening left shoulder/arm pain.  He denies any injury but rather describes a gradual onset of pain that has become worse recently.  He localizes his pain to the proximal humerus just below the deltoid.  Pain is noticeable at times just with supporting the weight of his arm.  He also will get pain with carrying certain objects.  Internal rotation is also quite challenging.  He does have pain at night as well.  No prior shoulder surgeries.  No imaging to date.  He does not take any medications for the pain.  He is here today with his wife.  Past medical history reviewed Medications reviewed Allergies reviewed    Review of Systems As above    Objective:   Physical Exam  Well-developed, well-nourished.  No acute distress  Left shoulder: Full active and passive forward flexion and abduction.  Positive painful arc.  Internal rotation actively is limited to about 50 to 60 degrees.  Good external rotation.  No tenderness to palpation over the acromioclavicular joint nor over the bicipital groove.  Rotator cuff strength is 5/5 with resisted supraspinatus and external rotation.  He does have some pain with resisted internal rotation but good strength.  Equivocal O'Brien's.  Neurovascularly intact distally.      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  Victor Best will follow up with me in a few days for a complete left shoulder ultrasound to evaluate his rotator cuff and biceps tendon.  We will delineate treatment based on the results of those two studies.  This note was dictated using Dragon  naturally speaking software and may contain errors in syntax, spelling, or content which have not been identified prior to signing this note.

## 2021-06-07 ENCOUNTER — Ambulatory Visit: Payer: Self-pay

## 2021-06-07 ENCOUNTER — Ambulatory Visit (INDEPENDENT_AMBULATORY_CARE_PROVIDER_SITE_OTHER): Payer: 59 | Admitting: Sports Medicine

## 2021-06-07 VITALS — BP 114/80 | Ht 70.0 in | Wt 189.0 lb

## 2021-06-07 DIAGNOSIS — M25512 Pain in left shoulder: Secondary | ICD-10-CM

## 2021-06-07 NOTE — Progress Notes (Signed)
   Subjective:    Patient ID: Victor Best, male    DOB: 1955-10-16, 65 y.o.   MRN: [id number]  HPI  Mr. Larsh presents today for complete left shoulder ultrasound.  Recent x-rays of his left shoulder show some degenerative changes at the [facility] joint but were otherwise unremarkable.  No abnormality was seen in the proximal humerus.  He continues to endorse pain in the proximal humerus but denies pain in the shoulder.  However, his arm pain is worse with shoulder mobility such as internal rotation.    Review of Systems     Objective:   Physical Exam  Left shoulder: Full range of motion.  There may be some slight atrophy of the supraspinatus and infraspinatus muscle bellies.  No tenderness to palpation over the acromioclavicular joint.  His rotator cuff strength is 5/5 bilaterally.  Pain is reproduced on the left with resisted internal rotation.  Negative O'Brien's.  Negative speeds, negative Yergason's.  Neurovascularly intact distally.  Left shoulder ultrasound was completed today.  Biceps tendon was well visualized in the bicipital groove.  No abnormalities seen.  Acromioclavicular joint shows some mild degenerative changes and a small geyser sign.  Subscapularis was well visualized without abnormality.  Supraspinatus does show hypoechoic changes in the midportion distally as well as some slight cortical irregularity in this area.  Infraspinatus appears to be somewhat atrophied.  Glenohumeral joint is unremarkable.  I also did an ultrasound of his proximal humerus laterally where he localizes his pain.  I did not appreciate any sort of soft tissue mass.  Deltoid muscle is unremarkable.  Findings are consistent with probable high-grade tear of the distal supraspinatus tendon.      Assessment & Plan:   Left arm pain likely secondary to high-grade supraspinatus tendon tear  Mr. Omary told me that his arm felt great when having his blood pressure checked earlier today.  He feels that compression  over the area may be beneficial.  Although that is obviously not a traditional treatment for this injury, we will try a compression sleeve for the next 3 weeks.  If he does not find it to be helpful, then I recommend that he return to the office for a diagnostic/therapeutic subacromial cortisone injection.  Although he does have some tearing of his supraspinatus tendon, his function including his strength appear to be well-preserved.  This note was dictated using Dragon naturally speaking software and may contain errors in syntax, spelling, or content which have not been identified prior to signing this note.

## 2021-06-15 ENCOUNTER — Encounter: Payer: Self-pay | Admitting: Gastroenterology

## 2021-06-15 ENCOUNTER — Ambulatory Visit (INDEPENDENT_AMBULATORY_CARE_PROVIDER_SITE_OTHER): Payer: 59 | Admitting: Gastroenterology

## 2021-06-15 VITALS — BP 140/84 | HR 76 | Ht 70.0 in | Wt 190.4 lb

## 2021-06-15 DIAGNOSIS — R195 Other fecal abnormalities: Secondary | ICD-10-CM | POA: Diagnosis not present

## 2021-06-15 DIAGNOSIS — R194 Change in bowel habit: Secondary | ICD-10-CM

## 2021-06-15 MED ORDER — DICYCLOMINE HCL 20 MG PO TABS: 20.0000 mg | ORAL_TABLET | Freq: Four times a day (QID) | ORAL | 3 refills | Status: DC

## 2021-06-15 NOTE — Progress Notes (Signed)
Referring Provider: Janora Norlander, DO Primary Care Physician:  Janora Norlander, DO  Chief complaint:  Altered bowel habits   IMPRESSION:  Constipation with BM every 5 days: Altered bowel habits with change in stool caliber and increase in mucous not explained by colonoscopy, TSH, or serum calcium levels. History today is sounding more like there could be a component of pelvic floor dysfunction. I suggested anorectal manometry, but, Victor Best is in a contemplative stage over it. Victor Best will call if Victor Best would like to schedule.   Mucous in the stool may be normal for him given his recent reassuring colonoscopy.   History of colon polyp: Surveillance colonoscopy due in 2029  PLAN: - Dicyclomine 20 mg QID prior to meals for symptomatic relief - Increase Linzess to 290 mg to see if this improves your constipation (samples provided today) - Continue Metamucil/Miralax PRN - Discussed diet choices that treat constipation - Anorectal manometry +/- biofeedback/PT if appropriate - Consider trial of low FODMAP diet - Consider CT abd/pelvis with any development of any alarm features - Follow-up in 3-6 months, earlier as needed   Please see the "Patient Instructions" section for addition details about the plan.  HPI: Victor Best is a 65 y.o. male who returns in follow-up. Victor Best was last seen 03/30/21. His wife accompanies him to this appointment and contributes to the history. Victor Best has a history of Alpha Gal, asthma, and obesity.   Since childhood, would have a bowel movement every 3-4 days. At the time of his initial consultation 01/2021 Victor Best reported 6 months of small, straw sized daily bowel movements with associated gas/eructation and bloating. Rare abdominal pain.   Colonoscopy 01/2021 did not provide an answer.  A small tubular adenoma was removed from the appendiceal orifice. CMP, CBC, TSH have been normal  In fact, symptoms actually worsened after his colonoscopy. Passed a very strange bowel  movement. Since then, things were more normal with a BM every 3 days, although still not normal.   Abdominal films 04/16/2021 showed moderate fecal load throughout the colon. No impaction. No other abnormalities.  Victor Best returns today in follow-up. Victor Best continues to have constipation despite starting Linzess 145 mcg daily in late August. Previously using GasEx, Metamucil daily, Miralax but Victor Best has not been using them while on Linzess and Victor Best never really found them very helpful. No significant change in orange, banana, prunes, and yogurt.   Having a soft, pudding-like bowel movement every 4-5 days. By day 5 Victor Best doesn't feel good. His abdominal pain has essentially resolved. There is a sense of incomplete evacuation.  No straining.  Victor Best admits to not drinking enough water. Victor Best continues to have some mucous but no bleeding.     Past Medical History:  Diagnosis Date   Allergy to alpha-gal    Asthma    Mastoiditis 2004   hospitalized for this. no recurrent issues   Obesity     Past Surgical History:  Procedure Laterality Date   KNEE ARTHROSCOPY Right    TONSILLECTOMY      Current Outpatient Medications  Medication Sig Dispense Refill   linaclotide (LINZESS) 145 MCG CAPS capsule Take 1 capsule (145 mcg total) by mouth daily before breakfast. 30 capsule 3   No current facility-administered medications for this visit.    Allergies as of 06/15/2021   (No Known Allergies)    Family History  Problem Relation Age of Onset   Heart disease Mother    Heart attack Mother 63  cause of death   Diabetes Father    Cancer Father        brain tumor   Colon cancer Paternal Uncle    Asthma Paternal Uncle    Cancer Paternal Grandfather        ? type   Suicidality Son    Esophageal cancer Neg Hx    Pancreatic cancer Neg Hx    Stomach cancer Neg Hx       Physical Exam: General:   Alert,  well-nourished, pleasant and cooperative in NAD Head:  Normocephalic and atraumatic. Eyes:  Sclera  clear, no icterus.   Conjunctiva pink. Abdomen:  Soft, nontender, nondistended, normal bowel sounds, no rebound or guarding. No hepatosplenomegaly.   Neurologic:  Alert and  oriented x4;  grossly nonfocal Skin:  Intact without significant lesions or rashes. Psych:  Alert and cooperative. Normal mood and affect.    Kalyse Meharg L. Tarri Glenn, MD, MPH 06/15/2021, 2:12 PM

## 2021-06-15 NOTE — Patient Instructions (Addendum)
It was pleasure to see you today.  Let's see if increasing your Linzess to 290 mg daily provides additional relief.  The most frequent side effect would be diarrhea.  I gave you some samples today.   You may add Miralax 17 grams as often as twice daily to try to even out your bowel habits.   I recommend that you eat at least 25-30 grams of fiber daily and drink at least 64 ounces of water daily. Please find a way to drink more water! I think this could make a big different for you.   Natural laxatives include prunes, apples, apricots, cherries, peaches, pears, aloe, rhubarb, kiwi, mango, papaya, and watermelon. In particular, two kiwi a day has been show to cause less likely to cause bloating than prunes or psyllium.  I think you should consider anorectal manometry to figure out if this is contributing to your constipation. Those results can guide physical therapy to help retrain your pelvic floor muscles into working better to have a bowel movement. Please call me at any time to move forward with scheduling the manometry at the hospital.   I'd like to see you back in 3-6 months, earlier if you are having any difficulties.   Thank You   Santo Held

## 2021-07-05 ENCOUNTER — Ambulatory Visit: Payer: 59 | Admitting: Sports Medicine

## 2021-07-30 ENCOUNTER — Other Ambulatory Visit: Payer: Self-pay | Admitting: Family Medicine

## 2021-07-30 ENCOUNTER — Telehealth: Payer: Self-pay | Admitting: Family Medicine

## 2021-07-30 MED ORDER — LINACLOTIDE 145 MCG PO CAPS: 145.0000 ug | ORAL_CAPSULE | Freq: Every day | ORAL | 12 refills | Status: DC

## 2021-07-30 NOTE — Telephone Encounter (Signed)
I have sent in rx.  Not sure how well ins will cover.  Ok to place a few bottles of the 22mcg up front.  He can take 2 at a time to equal 145.

## 2021-07-30 NOTE — Telephone Encounter (Signed)
Pt has been informed on voicemail.

## 2021-07-30 NOTE — Telephone Encounter (Signed)
Pt's wife calling to see if he can get samples of linzess. If we do not have any, would Dr. Lajuana Ripple write a prescription for him to get more. She stated that he was given samples about 2 months ago. Please call back and advise.

## 2022-01-08 ENCOUNTER — Telehealth: Payer: Self-pay | Admitting: Family Medicine

## 2022-01-08 NOTE — Telephone Encounter (Signed)
Victor Free do you know anything about this?

## 2022-03-15 ENCOUNTER — Encounter: Payer: Self-pay | Admitting: Family Medicine

## 2022-03-15 ENCOUNTER — Ambulatory Visit (INDEPENDENT_AMBULATORY_CARE_PROVIDER_SITE_OTHER): Payer: 59 | Admitting: Family Medicine

## 2022-03-15 VITALS — BP 125/79 | HR 61 | Temp 98.1°F | Ht 70.0 in | Wt 192.0 lb

## 2022-03-15 DIAGNOSIS — Z0000 Encounter for general adult medical examination without abnormal findings: Secondary | ICD-10-CM

## 2022-03-15 DIAGNOSIS — L309 Dermatitis, unspecified: Secondary | ICD-10-CM

## 2022-03-15 DIAGNOSIS — E7800 Pure hypercholesterolemia, unspecified: Secondary | ICD-10-CM

## 2022-03-15 DIAGNOSIS — E663 Overweight: Secondary | ICD-10-CM

## 2022-03-15 DIAGNOSIS — Z9889 Other specified postprocedural states: Secondary | ICD-10-CM

## 2022-03-15 DIAGNOSIS — K5909 Other constipation: Secondary | ICD-10-CM | POA: Diagnosis not present

## 2022-03-15 DIAGNOSIS — Z0001 Encounter for general adult medical examination with abnormal findings: Secondary | ICD-10-CM

## 2022-03-15 DIAGNOSIS — Z130 Encounter for screening for diseases of the blood and blood-forming organs and certain disorders involving the immune mechanism: Secondary | ICD-10-CM

## 2022-03-15 DIAGNOSIS — G8929 Other chronic pain: Secondary | ICD-10-CM

## 2022-03-15 DIAGNOSIS — Z125 Encounter for screening for malignant neoplasm of prostate: Secondary | ICD-10-CM

## 2022-03-15 DIAGNOSIS — M25561 Pain in right knee: Secondary | ICD-10-CM

## 2022-03-15 MED ORDER — BETAMETHASONE DIPROPIONATE 0.05 % EX CREA: TOPICAL_CREAM | Freq: Two times a day (BID) | CUTANEOUS | 0 refills | Status: DC

## 2022-03-15 MED ORDER — LINACLOTIDE 145 MCG PO CAPS: 145.0000 ug | ORAL_CAPSULE | Freq: Every day | ORAL | 12 refills | Status: DC

## 2022-03-15 NOTE — Patient Instructions (Signed)

## 2022-03-15 NOTE — Progress Notes (Signed)
Victor Best is a 66 y.o. male presents to office today for annual physical exam examination.    Concerns today include: 1.  Right knee pain Patient reports that he has been having some increased pain and instability of the right knee ever since he slipped on some oil on a ladder and hyperflexed it.  He has a palpable knot at the patella now.  He has previously required arthroscopic surgery 15 years ago with a provider in [place], whom he believes not to be in practice anymore.  He has not taken any oral analgesics up to this point but wanted to mention this.  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   Past Medical History:  Diagnosis Date   Allergy to alpha-gal    Asthma    Mastoiditis 2004   hospitalized for this. 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  He is married [place]     They have 1 son who sadly passed away after being involved in a motorcycle accident in 2020.     They have  2 grandchildren that are teenagers whom they enjoy spending time with.   Social Determinants of Health   Financial Resource Strain: Not on file  Food Insecurity: Not on file  Transportation Needs: Not on file  Physical Activity: Not on file  Stress: Not on file  Social Connections: Not on file  Intimate Partner Violence: Not on file   Past Surgical History:  Procedure Laterality Date   KNEE ARTHROSCOPY Right    TONSILLECTOMY     Family History  Problem Relation Age of Onset   Heart disease Mother    Heart attack Mother 71      cause of death   Diabetes Father    Cancer Father        brain tumor   Colon cancer Paternal Uncle    Asthma Paternal Uncle    Cancer Paternal Grandfather        ? type   Suicidality Son    Esophageal cancer Neg Hx    Pancreatic cancer Neg Hx    Stomach cancer Neg Hx     Current Outpatient Medications:    dicyclomine (BENTYL) 20 MG tablet, Take 1 tablet (20 mg total) by mouth in the morning, at noon, in the evening, and at bedtime. Before mels, Disp: 120 tablet, Rfl: 3   linaclotide (LINZESS) 145 MCG CAPS  capsule, Take 1 capsule (145 mcg total) by mouth daily before breakfast., Disp: 30 capsule, Rfl: 12  No Known Allergies   ROS: Review of Systems A comprehensive review of systems was negative except for: Eyes: positive for contacts/glasses Gastrointestinal: positive for constipation Musculoskeletal: positive for right knee pain    Physical exam BP 125/79   Pulse 61   Temp 98.1 F (36.7 C)   Ht 5' 10"  (1.778 m)   Wt 192 lb (87.1 kg)   SpO2 94%   BMI 27.55 kg/m  General appearance: alert, cooperative, appears stated age, and no distress Head: Normocephalic, without obvious abnormality, atraumatic Eyes: negative findings: lids and lashes normal, conjunctivae and sclerae normal, corneas clear, and pupils equal, round, reactive to light and accomodation Ears: normal TM's and external ear canals both ears Nose: Nares normal. Septum  midline. Mucosa normal. No drainage or sinus tenderness. Throat: lips, mucosa, and tongue normal; teeth and gums normal Neck: no adenopathy, no carotid bruit, supple, symmetrical, trachea midline, and thyroid not enlarged, symmetric, no tenderness/mass/nodules Back: symmetric, no curvature. ROM normal. No CVA tenderness. Lungs: clear to auscultation bilaterally Chest wall: no tenderness Heart: regular rate and rhythm, S1, S2 normal, no murmur, click, rub or gallop Abdomen: soft, non-tender; bowel sounds normal; no masses,  no organomegaly Extremities: extremities normal, atraumatic, no cyanosis or edema Pulses: 2+ and symmetric Skin: Skin color, texture, turgor normal. No rashes or lesions Lymph nodes: Cervical, supraclavicular, and axillary nodes normal. Neurologic: Grossly normal.  Patellar DTRs 1/4 bilaterally Psych: Mood stable, speech normal, affect appropriate   Assessment/ Plan: Victor Best here for annual physical exam.   Annual physical exam  Pure hypercholesterolemia - Plan: Lipid Panel, CMP14+EGFR, TSH  Overweight (BMI 25.0-29.9) - Plan: Lipid Panel, CMP14+EGFR, TSH  Chronic constipation - Plan: TSH, linaclotide (LINZESS) 145 MCG CAPS capsule  Screening for malignant neoplasm of prostate - Plan: PSA  Screening, anemia, deficiency, iron - Plan: CBC  Dermatitis - Plan: betamethasone dipropionate 0.05 % cream  Chronic pain of right knee  History of arthroscopic knee surgery  Check fasting labs.  Sample of Linzess and Trulance provided today.  Discussed indications for use.  Coupon provided.  He will contact me if he prefers the Trulance instead of the Linzess and I will be glad to prescribe.  Anticipate insurance change in 1 month so hopefully his new insurance company will cover these meds well  Asymptomatic from a prostate standpoint.  Check PSA  Dermatitis is occasional and typically related to consumption of alpha gal containing meats.  This is relieved by  betamethasone so I have renewed that  For his chronic right knee pain I have put him on Dr Amedeo Kinsman' schedule for this fall.  I encouraged him to take OTC analgesics to see if this might alleviate some of the discomfort.  Plain films were not obtained today but will likely need to be updated.  Counseled on healthy lifestyle choices, including diet (rich in fruits, vegetables and lean meats and low in salt and simple carbohydrates) and exercise (at least 30 minutes of moderate physical activity daily).  Patient to follow up in 1 year for annual exam or sooner if needed.  Jaking Thayer M. Lajuana Ripple, DO

## 2022-03-16 LAB — CBC
Hematocrit: 44.7 % (ref 37.5–51.0)
Hemoglobin: 15.2 g/dL (ref 13.0–17.7)
MCH: 30.8 pg (ref 26.6–33.0)
MCHC: 34 g/dL (ref 31.5–35.7)
MCV: 91 fL (ref 79–97)
Platelets: 189 10*3/uL (ref 150–450)
RBC: 4.94 x10E6/uL (ref 4.14–5.80)
RDW: 13 % (ref 11.6–15.4)
WBC: 5.5 10*3/uL (ref 3.4–10.8)

## 2022-03-16 LAB — CMP14+EGFR
ALT: 13 IU/L (ref 0–44)
AST: 14 IU/L (ref 0–40)
Albumin/Globulin Ratio: 1.6 (ref 1.2–2.2)
Albumin: 4.4 g/dL (ref 3.9–4.9)
Alkaline Phosphatase: 59 IU/L (ref 44–121)
BUN/Creatinine Ratio: 21 (ref 10–24)
BUN: 18 mg/dL (ref 8–27)
Bilirubin Total: 0.5 mg/dL (ref 0.0–1.2)
CO2: 24 mmol/L (ref 20–29)
Calcium: 9.2 mg/dL (ref 8.6–10.2)
Chloride: 102 mmol/L (ref 96–106)
Creatinine, Ser: 0.87 mg/dL (ref 0.76–1.27)
Globulin, Total: 2.7 g/dL (ref 1.5–4.5)
Glucose: 88 mg/dL (ref 70–99)
Potassium: 4.4 mmol/L (ref 3.5–5.2)
Sodium: 141 mmol/L (ref 134–144)
Total Protein: 7.1 g/dL (ref 6.0–8.5)
eGFR: 96 mL/min/{1.73_m2} (ref >59)

## 2022-03-16 LAB — PSA: Prostate Specific Ag, Serum: 1.3 ng/mL (ref 0.0–4.0)

## 2022-03-16 LAB — LIPID PANEL
Chol/HDL Ratio: 4.7 ratio (ref 0.0–5.0)
Cholesterol, Total: 229 mg/dL — ABNORMAL HIGH (ref 100–199)
HDL: 49 mg/dL (ref >39)
LDL Chol Calc (NIH): 165 mg/dL — ABNORMAL HIGH (ref 0–99)
Triglycerides: 86 mg/dL (ref 0–149)
VLDL Cholesterol Cal: 15 mg/dL (ref 5–40)

## 2022-03-16 LAB — TSH: TSH: 2.6 u[IU]/mL (ref 0.450–4.500)

## 2022-06-06 ENCOUNTER — Ambulatory Visit (INDEPENDENT_AMBULATORY_CARE_PROVIDER_SITE_OTHER): Payer: 59 | Admitting: Family Medicine

## 2022-06-06 ENCOUNTER — Encounter: Payer: Self-pay | Admitting: Family Medicine

## 2022-06-06 VITALS — BP 129/82 | HR 68 | Temp 98.4°F | Ht 70.0 in | Wt 195.0 lb

## 2022-06-06 DIAGNOSIS — J0110 Acute frontal sinusitis, unspecified: Secondary | ICD-10-CM | POA: Diagnosis not present

## 2022-06-06 DIAGNOSIS — J4521 Mild intermittent asthma with (acute) exacerbation: Secondary | ICD-10-CM

## 2022-06-06 MED ORDER — PREDNISONE 20 MG PO TABS: ORAL_TABLET | ORAL | 0 refills | Status: DC

## 2022-06-06 MED ORDER — ALBUTEROL SULFATE HFA 108 (90 BASE) MCG/ACT IN AERS: 2.0000 | INHALATION_SPRAY | Freq: Four times a day (QID) | RESPIRATORY_TRACT | 0 refills | Status: DC | PRN

## 2022-06-06 MED ORDER — AMOXICILLIN-POT CLAVULANATE 875-125 MG PO TABS: 1.0000 | ORAL_TABLET | Freq: Two times a day (BID) | ORAL | 0 refills | Status: AC

## 2022-06-06 NOTE — Progress Notes (Signed)
Acute Office Visit  Subjective:     Patient ID: Victor Best, male    DOB: 10/14/1955, 66 y.o.   MRN: [id number]  Chief Complaint  Patient presents with   Cough   Facial Pain    HPI Victor Best reports intermittent wheezing and shortness of breath for 10 days. He also reports a nonproductive cough, nasal congestion, and sinus pressure. Symptoms have been worsening. He reports the shortness of breath as a difficulty exhaling. He also reports a tightness in his chest when this occurs. This is worse at night. He has been taking xyzal and flonase without improvement. He denies chest pain, fever, chills, nausea, vomiting, diarrhea, or edema. He has a history of childhood asthma. He reports similar symptoms that occur every spring and fall. Usually he can take xyzal and flonse and symptoms improve. He has previously been treated with augmentin and prednisone with good relief in the past. He does not currently have an inhaler but has in the past with good relief.   ROS As per HPI.       Objective:    BP 129/82   Pulse 68   Temp 98.4 F (36.9 C) (Temporal)   Ht '5\' 10"'$  (1.778 m)   Wt 195 lb (88.5 kg)   SpO2 92%   BMI 27.98 kg/m  SpO2 Readings from Last 3 Encounters:  06/06/22 92%  03/15/22 94%  06/15/21 98%      Physical Exam Vitals and nursing note reviewed.  Constitutional:      General: He is not in acute distress.    Appearance: He is not ill-appearing, toxic-appearing or diaphoretic.  HENT:     Right Ear: Tympanic membrane, ear canal and external ear normal.     Left Ear: Tympanic membrane, ear canal and external ear normal.     Nose: Congestion present.     Right Sinus: Frontal sinus tenderness present.     Left Sinus: Frontal sinus tenderness present.     Mouth/Throat:     Mouth: Mucous membranes are moist.     Pharynx: Oropharynx is clear. No pharyngeal swelling or posterior oropharyngeal erythema.     Tonsils: No tonsillar exudate.  Eyes:     General:        Right  eye: No discharge.        Left eye: No discharge.     Pupils: Pupils are equal, round, and reactive to light.  Cardiovascular:     Rate and Rhythm: Normal rate and regular rhythm.     Heart sounds: Normal heart sounds. No murmur heard. Pulmonary:     Effort: No tachypnea, bradypnea, accessory muscle usage, prolonged expiration or respiratory distress.     Breath sounds: No decreased air movement. Examination of the right-upper field reveals wheezing. Examination of the left-upper field reveals wheezing. Examination of the right-middle field reveals wheezing. Examination of the left-middle field reveals wheezing. Wheezing present. No rhonchi or rales.  Musculoskeletal:     Cervical back: Neck supple. No rigidity.     Right lower leg: No edema.     Left lower leg: No edema.  Lymphadenopathy:     Cervical: No cervical adenopathy.  Skin:    General: Skin is warm and dry.  Neurological:     General: No focal deficit present.     Mental Status: He is alert and oriented to person, place, and time.  Psychiatric:        Mood and Affect: Mood normal.  Behavior: Behavior normal.     No results found for any visits on 06/06/22.      Assessment & Plan:   Victor Best was seen today for cough and facial pain.  Diagnoses and all orders for this visit:  Mild intermittent asthma with acute exacerbation Consistent with asthma exacerbation, however no formal diagnosis of asthma since childhood. Prednisone burst as below. Albuterol prn. Continue xyzal and flonase. Declined referral for PFT to assess for asthma diagnosis.  -     albuterol (VENTOLIN HFA) 108 (90 Base) MCG/ACT inhaler; Inhale 2 puffs into the lungs every 6 (six) hours as needed for wheezing or shortness of breath. -     predniSONE (DELTASONE) 20 MG tablet; 2 po at sametime daily for 5 days- start tomorrow  Acute non-recurrent frontal sinusitis Augmentin as below. -     amoxicillin-clavulanate (AUGMENTIN) 875-125 MG tablet; Take 1  tablet by mouth 2 (two) times daily for 7 days.  Return to office for new or worsening symptoms, or if symptoms persist.   The patient indicates understanding of these issues and agrees with the plan.   Gwenlyn Perking, FNP

## 2022-06-06 NOTE — Patient Instructions (Signed)
Asthma Attack  Asthma attack, also called asthma flare or acute bronchospasm, is the sudden narrowing and tightening of the lower airways (bronchi) in the lungs, that can make it hard to breathe. The narrowing is caused by inflammation and tightening of the smooth muscle that wraps around the lower airways in the lungs. Asthma attacks may cause coughing, high-pitched whistling sounds when you breathe, most often when you breathe out (wheezing), trouble breathing (shortness of breath), and chest pain. The airways may produce extra mucus caused by the inflammation and irritation. During an asthma attack, it can be difficult to breathe. It is important to get treatment right away. Asthma attacks can range from minor to life-threatening. What are the causes? Possible causes or triggers of this condition include: Household allergens like dust, pet dander, and cockroaches. Mold and pollen from trees or grass. Air pollutants such as household cleaners, aerosol sprays, strong odors, and smoke of any kind. Weather changes and cold air. Stress or strong emotions such as crying or laughing hard. Exercise or activity that requires a lot of energy. Certain medicines or medical conditions such as: Aspirin or beta-blockers. Infections or inflammatory conditions, such as a flu (influenza), a cold, pneumonia, or inflammation of the nasal membranes (rhinitis). Gastroesophageal reflux disease (GERD). GERD is a condition in which stomach acid backs up into your esophagus and spills into your trachea (windpipe), which can irritate your airways. What are the signs or symptoms? Symptoms of this condition include: Wheezing. Excessive coughing. This may only happen at night. Chest tightness or pain. Shortness of breath. Difficulty talking in complete sentences. Feeling like you cannot get enough air, no matter how hard you breathe (air hunger). How is this diagnosed? This condition may be diagnosed based on: A  physical exam and your medical history. Your symptoms. Tests to check for other causes of your symptoms or other conditions that may have triggered your asthma attack. These tests may include: A chest X-ray. Blood tests. Lung function studies (spirometry) to evaluate the flow of air in your lungs. How is this treated? Treatment for this condition depends on the severity and cause of your asthma attack. For mild attacks, you may receive medicines through a hand-held inhaler (metered dose inhaler or MDI) or through a device that turns liquid medicine into a mist (nebulizer). These medicines include: Quick relief or rescue medicines that quickly relax the airways and lungs. Long-acting medicines that are used daily to prevent (control) your asthma symptoms. For moderate or severe attacks, you may be treated with steroid medicines by mouth or through an IV injection at the hospital. For severe attacks, you may need oxygen therapy or a breathing machine (ventilator). If your asthma attack was caused by an infection from bacteria, you will be given antibiotic medicines. Follow these instructions at home: Medicines Take over-the-counter and prescription medicines only as told by your health care provider. If you were prescribed an antibiotic medicine, take it as told by your health care provider. Do not stop using the antibiotic even if you start to feel better. Tell your doctor if you are pregnant or may be pregnant to make sure your asthma medicine is safe to use during pregnancy. Avoiding triggers  Keep track of things that trigger your asthma attacks. Avoid exposure to these triggers. Do not use any products that contain nicotine or tobacco. These products include cigarettes, chewing tobacco, and vaping devices, such as e-cigarettes. If you need help quitting, ask your health care provider. When there is a lot   of pollen, air pollution, or humidity, keep windows closed and use an air conditioner  or go to places with air conditioning. Asthma action plan Work with your health care provider to make a written plan for managing and treating your asthma attacks (asthma action plan). This plan should include: A list of your asthma triggers and how to avoid them. A list of symptoms that you may have during an asthma attack. Information about which medicine to take, when to take the medicine, and how much of the medicine to take. Information to help you understand your peak flow measurements. Daily actions that you can take to control your asthma symptoms. Contact information for your health care providers. If you have an asthma attack, act quickly. Follow the emergency steps on your written asthma action plan. This may prevent you from needing to go to the hospital. Talk to a family member or close friend about your asthma action plan and who to contact in case you need help. General instructions Avoid excessive exercise or activity until your asthma attack goes away. Stay up to date on all your vaccines, such as flu and pneumonia vaccines. Keep all follow-up visits. This is important. Contact a health care provider if: You have followed your action plan for 1 hour and your peak flow reading is still at 50-79% of your personal best. This is in the yellow zone, which means "caution." You need to use your quick reliever medicine more frequently than normal. Your medicines are causing side effects, such as rash, itching, swelling, or trouble breathing. Your symptoms do not improve after taking medicine. You have a fever. Get help right away if: Your peak flow reading is less than 50% of your personal best. This is in the red zone, which means "danger." You develop chest pain or discomfort. Your medicines no longer seem to be helping. You are coughing up bloody mucus. You have a fever and your symptoms suddenly get worse. You have trouble swallowing. You feel very tired, and breathing  becomes tiring. These symptoms may be an emergency. Get help right away. Call 911. Do not wait to see if the symptoms will go away. Do not drive yourself to the hospital. Summary Asthma attacks are caused by narrowing or tightness in air passages, which causes shortness of breath, coughing, and wheezing. Many things can trigger an asthma attack, such as allergens, weather changes, exercise, strong odors, and smoke of any kind. If you have an asthma attack, act quickly. Follow the emergency steps on your written asthma action plan. Get help right away if you have severe trouble breathing, chest pain, or fever, or if your home medicines are no longer helping with your symptoms. This information is not intended to replace advice given to you by your health care provider. Make sure you discuss any questions you have with your health care provider. Document Revised: 05/27/2021 Document Reviewed: 05/27/2021 Elsevier Patient Education  2023 [employer] Inc[employer]  

## 2022-06-07 ENCOUNTER — Other Ambulatory Visit: Payer: Self-pay | Admitting: Family Medicine

## 2022-06-07 DIAGNOSIS — R21 Rash and other nonspecific skin eruption: Secondary | ICD-10-CM

## 2022-06-07 DIAGNOSIS — W57XXXA Bitten or stung by nonvenomous insect and other nonvenomous arthropods, initial encounter: Secondary | ICD-10-CM

## 2022-06-17 ENCOUNTER — Telehealth: Payer: Self-pay | Admitting: Family Medicine

## 2022-06-17 NOTE — Telephone Encounter (Signed)
  Prescription Request  06/17/2022  Is this a "Controlled Substance" medicine? no  Have you seen your PCP in the last 2 weeks? Seen on 10/19  If YES, route message to pool  -  If NO, patient needs to be scheduled for appointment.  What is the name of the medication or equipment? predniSONE (DELTASONE) 20 MG tablet  Have you contacted your pharmacy to request a refill? yes   Which pharmacy would you like this sent to? [facility] in [place]  Patient has not been able to sleep and said that the medicine was helping him sleep    Patient notified that their request is being sent to the clinical staff for review and that they should receive a response within 2 business days.

## 2022-06-17 NOTE — Telephone Encounter (Signed)
Needs an appointment.

## 2022-06-17 NOTE — Telephone Encounter (Signed)
LMOVM refill request is for a steroid that was given for his asthma exacerbation, which usually makes people jittery or hypes them up, it usually does not help them sleep. If he is having problems sleep I suggest he make an appointment w/ his PCP to discuss this.

## 2022-06-17 NOTE — Telephone Encounter (Signed)
I spoke to pt's wife and she requested a televisit tomorrow because pt hasn't been able to sleep at night due to his SOB. I did schedule appt tomorrow with DOD.

## 2022-06-18 ENCOUNTER — Encounter: Payer: Self-pay | Admitting: Nurse Practitioner

## 2022-06-18 ENCOUNTER — Ambulatory Visit (INDEPENDENT_AMBULATORY_CARE_PROVIDER_SITE_OTHER): Payer: 59 | Admitting: Nurse Practitioner

## 2022-06-18 DIAGNOSIS — J4521 Mild intermittent asthma with (acute) exacerbation: Secondary | ICD-10-CM | POA: Diagnosis not present

## 2022-06-18 MED ORDER — PREDNISONE 10 MG (21) PO TBPK: ORAL_TABLET | ORAL | 0 refills | Status: DC

## 2022-06-18 NOTE — Progress Notes (Signed)
   Virtual Visit  Note Due to COVID-19 pandemic this visit was conducted virtually. This visit type was conducted due to national recommendations for restrictions regarding the COVID-19 Pandemic (e.g. social distancing, sheltering in place) in an effort to limit this patient's exposure and mitigate transmission in our community. All issues noted in this document were discussed and addressed.  A physical exam was not performed with this format.  I connected with Victor Best on 06/18/22 at 10:50 by telephone and verified that I am speaking with the correct person using two identifiers. Victor Best is currently located at home and his wife is currently with him during visit. The provider, Mary-Margaret Hassell Done, FNP is located in their office at time of visit.  I discussed the limitations, risks, security and privacy concerns of performing an evaluation and management service by telephone and the availability of in person appointments. I also discussed with the patient that there may be a patient responsible charge related to this service. The patient expressed understanding and agreed to proceed.   History and Present Illness:  Patient is having breathing issues. He has a history of asthma. Was seen a week ago with same issues and he was given a steroid shot, augmentin and inhaler.  Asthma He complains of difficulty breathing, shortness of breath and wheezing. This is a recurrent problem. The current episode started in the past 7 days. The problem has been waxing and waning. Pertinent negatives include no dyspnea on exertion. His symptoms are aggravated by nothing. His symptoms are alleviated by OTC cough suppressant. He reports minimal improvement on treatment. His symptoms are not alleviated by OTC cough suppressant. His past medical history is significant for asthma.      Review of Systems  Respiratory:  Positive for shortness of breath and wheezing.   Cardiovascular:  Negative for dyspnea on  exertion.     Observations/Objective:  Alert and oriented- answers all questions appropriately No distress No cough noted No audible wheezing  Assessment and Plan: Victor Best in today with chief complaint of Asthma   1. Mild intermittent asthma with acute exacerbation Use inhaler as prescribed Force fluids Rest Run humidifier RTO prn - predniSONE (STERAPRED UNI-PAK 21 TAB) 10 MG (21) TBPK tablet; As directed x 6 days  Dispense: 21 tablet; Refill: 0    Follow Up Instructions: prn    I discussed the assessment and treatment plan with the patient. The patient was provided an opportunity to ask questions and all were answered. The patient agreed with the plan and demonstrated an understanding of the instructions.   The patient was advised to call back or seek an in-person evaluation if the symptoms worsen or if the condition fails to improve as anticipated.  The above assessment and management plan was discussed with the patient. The patient verbalized understanding of and has agreed to the management plan. Patient is aware to call the clinic if symptoms persist or worsen. Patient is aware when to return to the clinic for a follow-up visit. Patient educated on when it is appropriate to go to the emergency department.   Time call ended:  11:03  I provided 13 minutes of  non face-to-face time during this encounter.    Mary-Margaret Hassell Done, FNP

## 2022-06-27 ENCOUNTER — Encounter: Payer: Self-pay | Admitting: Nurse Practitioner

## 2022-06-27 ENCOUNTER — Ambulatory Visit (INDEPENDENT_AMBULATORY_CARE_PROVIDER_SITE_OTHER): Payer: 59

## 2022-06-27 ENCOUNTER — Ambulatory Visit (INDEPENDENT_AMBULATORY_CARE_PROVIDER_SITE_OTHER): Payer: 59 | Admitting: Nurse Practitioner

## 2022-06-27 VITALS — BP 126/84 | HR 59 | Temp 98.0°F | Resp 20 | Ht 70.0 in | Wt 192.0 lb

## 2022-06-27 DIAGNOSIS — J4541 Moderate persistent asthma with (acute) exacerbation: Secondary | ICD-10-CM | POA: Diagnosis not present

## 2022-06-27 DIAGNOSIS — R0602 Shortness of breath: Secondary | ICD-10-CM | POA: Diagnosis not present

## 2022-06-27 MED ORDER — BUDESONIDE-FORMOTEROL FUMARATE 80-4.5 MCG/ACT IN AERO: 2.0000 | INHALATION_SPRAY | Freq: Two times a day (BID) | RESPIRATORY_TRACT | 3 refills | Status: DC

## 2022-06-27 MED ORDER — LEVALBUTEROL HCL 1.25 MG/3ML IN NEBU
1.2500 mg | INHALATION_SOLUTION | RESPIRATORY_TRACT | Status: AC
  Administered 2022-06-27: 1.25 mg via RESPIRATORY_TRACT

## 2022-06-27 NOTE — Patient Instructions (Signed)
Budesonide; Formoterol Metered Dose Inhaler (MDI) What is this medication? BUDESONIDE; FORMOTEROL (byoo DES oh nide; for [place] te [place]) treats asthma and chronic obstructive pulmonary disease (COPD). It works by opening the airways of the lungs, making it easier to breathe. It is a combination of an inhaled steroid and a bronchodilator. It is often called a controller inhaler. Do not use it to treat a sudden asthma attack or COPD flare-up. This medicine may be used for other purposes; ask your health care provider or pharmacist if you have questions. COMMON BRAND NAME(S): Raford Pitcher, Symbicort What should I tell my care team before I take this medication? They need to know if you have any of these conditions: Diabetes Eye disease, such as glaucoma, cataracts, or blurred vision Heart disease High blood pressure Immune system problems Infection Irregular heartbeat or rhythm Liver disease Osteoporosis, weak bones Pheochromocytoma Seizures Thyroid disease An unusual or allergic reaction to budesonide, formoterol, other medications, foods, dyes, or preservatives Pregnant or trying to get pregnant Breast-feeding How should I use this medication? This medication is inhaled through the mouth. Shake well before using. Rinse your mouth with water after use. Make sure not to swallow the water. Take it as directed on the prescription label. Do not use it more often than directed. Keep taking it unless your care team tells you to stop. This medication comes with INSTRUCTIONS FOR USE. Ask your pharmacist for directions on how to use this medication. Read the information carefully. Talk to your pharmacist or care team if you have questions. Talk to your care team about the use of this medication in children. While it may be prescribed to children as young as 6 years for selected conditions, precautions do apply. Overdosage: If you think you have taken too much of this medicine contact a poison control center or  emergency room at once. NOTE: This medicine is only for you. Do not share this medicine with others. What if I miss a dose? If you miss a dose, use it as soon as you can. If it is almost time for your next dose, use only that dose. Do not use double or extra doses. What may interact with this medication? Do not take the medication with any of the following: Cisapride Dronedarone Other medications that contain long-acting beta-2 agonists (LABAs) like arfomoterol, formoterol, indacaterol, olodaterol, salmeterol, vilanterol Pimozide Thioridazine This medication may also interact with the following: Certain antibiotics like clarithromycin, telithromycin Certain antivirals for HIV or hepatitis Certain heart medications like atenolol, metoprolol Certain medications for blood pressure, heart disease, irregular heartbeat Certain medications for depression, anxiety, or psychotic disturbances Certain medications for fungal infections like ketoconazole, itraconazole Diuretics Grapefruit juice MAOIs like Marplan, Nardil, and Parnate Mifepristone Other medications that prolong the QT interval (an abnormal heart rhythm) Some vaccines Steroid medications like prednisone or cortisone Stimulant medications for attention disorders, weight loss, or to stay awake Theophylline This list may not describe all possible interactions. Give your health care provider a list of all the medicines, herbs, non-prescription drugs, or dietary supplements you use. Also tell them if you smoke, drink alcohol, or use illegal drugs. Some items may interact with your medicine. What should I watch for while using this medication? Visit your care team for regular checks on your progress. Tell your care team if your symptoms do not start to get better or if they get worse. Follow the plan from your care team for treating an acute asthma attack or bronchospasm (wheezing). If your symptoms get worse  or do not get better, call your  care team right away. If you have asthma, you and your care team should develop an Asthma Action Plan that is just for you. Be sure to know what to do if you are in the yellow (asthma is getting worse) or red (medical alert) zones. Do not treat yourself for coughs, colds or allergies without asking your care team for advice. Some nonprescription medications can affect this one. This medication may increase your risk of getting an infection. Call your care team for advice if you get a fever, chills, sore throat, or other symptoms of a cold or flu. Do not treat yourself. Try to avoid being around people who are sick. If you have not had the measles or chickenpox vaccines, tell your care team right away if you are around someone with these viruses. Using this medication for a long time may weaken your bones. The risk of bone fractures may be increased. Talk to your care team about your bone health. This medication may slow your child's growth if it is taken for a long time at high doses. Your care team will monitor your child's growth. What side effects may I notice from receiving this medication? Side effects that you should report to your care team as soon as possible: Allergic reactions--skin rash, itching, hives, swelling of the face, lips, tongue, or throat Flu-like symptoms--fever, chills, muscle pain, cough, headache, fatigue Heart rhythm changes--fast or irregular heartbeat, dizziness, feeling faint or lightheaded, chest pain, trouble breathing Increase in blood pressure Low adrenal gland function--nausea, vomiting, loss of appetite, unusual weakness or fatigue, dizziness Muscle pain or cramps Pain, tingling, or numbness in the hands or feet Sinus pain or pressure around the face or forehead Thrush--white patches in the mouth Wheezing or trouble breathing that is worse after use Side effects that usually do not require medical attention (report these to your care team if they continue or are  bothersome): Change in taste Cough Dry mouth Headache Hoarseness Sore throat Tremors or shaking Trouble sleeping This list may not describe all possible side effects. Call your doctor for medical advice about side effects. You may report side effects to FDA at 1-800-FDA-1088. Where should I keep my medication? Keep out of the reach of children and pets. Store at room temperature between 20 and 25 degrees C (68 and 77 degrees F). Keep inhaler away from extreme heat, cold or humidity. This medication is flammable. Avoid exposure to heat, fire, flame, and smoking. Throw away 3 months after removing it from the foil pouch, when the dose counter reads "0" or after the expiration date, whichever is first. To get rid of medications that are no longer needed or have expired: Take the medication to a medication take-back program. Check with your pharmacy or law enforcement to find a location. If you cannot return the medication, ask your pharmacist or care team how to get rid of this medication safely. NOTE: This sheet is a summary. It may not cover all possible information. If you have questions about this medicine, talk to your doctor, pharmacist, or health care provider.  2023 Elsevier/Gold Standard (2021-05-22 00:00:00)

## 2022-06-27 NOTE — Progress Notes (Signed)
TC from [facility] - Symbicort needed to be resent in grams instead of qty of each. resent

## 2022-06-27 NOTE — Progress Notes (Signed)
Subjective:    Patient ID: Victor Best, male    DOB: Jan 03, 1956, 66 y.o.   MRN: [id number]   Chief Complaint: Feels like he can't breathe (For 1 month/)   Pt seen today for trouble breathing (wheezing; feels like he "can breathe in but can't get it back out")  Shortness of Breath This is a recurrent problem. The current episode started 1 to 4 weeks ago (1 month ago). The problem occurs daily. The problem has been unchanged. Associated symptoms include orthopnea and wheezing. Pertinent negatives include no chest pain, fever, headaches, hemoptysis, leg pain, leg swelling, sputum production or swollen glands. The symptoms are aggravated by lying flat. The patient has no known risk factors for DVT/PE. He has tried OTC cough suppressants, oral steroids, body position changes and beta agonist inhalers (augmentin; Sleeps with HOB elevated 8in; feels the albuterol inhaler does help with wheezing and sob, but does not like to use it due to it making him feel "jittery") for the symptoms. Improvement on treatment: gets better while he is on medication, but as soon as he finishes treatment symptoms come back. His past medical history is significant for allergies and asthma.       Review of Systems  Constitutional:  Negative for activity change, chills and fever.  HENT:  Negative for congestion, sinus pressure and sinus pain.   Respiratory:  Positive for chest tightness, shortness of breath and wheezing. Negative for cough, hemoptysis, sputum production and stridor.        Chest tightness when he lays down at night  Cardiovascular:  Positive for orthopnea. Negative for chest pain, palpitations and leg swelling.  Skin:  Negative for color change.  Neurological:  Negative for dizziness, light-headedness and headaches.  Hematological:  Negative for adenopathy.  All other systems reviewed and are negative.      Objective:   Physical Exam Vitals and nursing note reviewed.  Constitutional:       General: He is not in acute distress.    Appearance: Normal appearance. He is not ill-appearing.  HENT:     Head: Normocephalic and atraumatic.     Right Ear: Tympanic membrane, ear canal and external ear normal.     Left Ear: Tympanic membrane, ear canal and external ear normal.     Nose: Nose normal.     Mouth/Throat:     Mouth: Mucous membranes are moist.     Pharynx: Oropharynx is clear.  Neck:     Vascular: No carotid bruit.  Cardiovascular:     Rate and Rhythm: Regular rhythm. Bradycardia present.     Pulses: Normal pulses.     Heart sounds: Normal heart sounds. No murmur heard.    No gallop.  Pulmonary:     Effort: Pulmonary effort is normal. No accessory muscle usage or respiratory distress.     Breath sounds: No stridor. Examination of the right-upper field reveals wheezing. Examination of the left-upper field reveals wheezing. Examination of the right-middle field reveals wheezing. Examination of the left-middle field reveals wheezing. Examination of the right-lower field reveals wheezing. Wheezing present. No rhonchi or rales.     Comments: S/p breathing tx lung sounds clear throughout Chest:     Chest wall: No deformity, tenderness or crepitus.  Abdominal:     General: Abdomen is flat. Bowel sounds are normal.     Palpations: Abdomen is soft.  Musculoskeletal:     Cervical back: Normal range of motion.     Right lower leg: No  edema.     Left lower leg: No edema.  Lymphadenopathy:     Cervical: No cervical adenopathy.  Skin:    General: Skin is warm and dry.     Capillary Refill: Capillary refill takes less than 2 seconds.  Neurological:     General: No focal deficit present.     Mental Status: He is alert and oriented to person, place, and time.  Psychiatric:        Mood and Affect: Mood normal.        Behavior: Behavior normal.       BP 126/84   Pulse (!) 59   Temp 98 F (36.7 C) (Temporal)   Resp 20   Ht '5\' 10"'$  (1.778 m)   Wt 192 lb (87.1 kg)   SpO2  97%   BMI 27.55 kg/m   S/P xopenex treatment- no more exp wheezes    Pre and post spirometry was doen but test result sa=not accesible- was not billed for this Assessment & Plan:   [name] in today with chief complaint of Feels like he can't breathe (For 1 month/)   1. SOB (shortness of breath) - DG Chest 2 View - PR EVAL OF BRONCHOSPASM - levalbuterol (XOPENEX) nebulizer solution 1.25 mg  2. Moderate persistent asthma with acute exacerbation Start symbicort Albuterol as needed If not improving will need referral to pulmonology - budesonide-formoterol (SYMBICORT) 80-4.5 MCG/ACT inhaler; Inhale 2 puffs into the lungs 2 (two) times daily.  Dispense: 10.3 g; Refill: 3    The above assessment and management plan was discussed with the patient. The patient verbalized understanding of and has agreed to the management plan. Patient is aware to call the clinic if symptoms persist or worsen. Patient is aware when to return to the clinic for a follow-up visit. Patient educated on when it is appropriate to go to the emergency department.   Mary-Margaret Hassell Done, FNP

## 2022-07-03 ENCOUNTER — Telehealth: Payer: Self-pay | Admitting: Family Medicine

## 2022-07-03 DIAGNOSIS — J4541 Moderate persistent asthma with (acute) exacerbation: Secondary | ICD-10-CM

## 2022-07-03 DIAGNOSIS — R0602 Shortness of breath: Secondary | ICD-10-CM

## 2022-07-03 NOTE — Telephone Encounter (Signed)
Please refer to pulmonology

## 2022-07-03 NOTE — Telephone Encounter (Signed)
Referral placed patient aware.

## 2022-07-17 ENCOUNTER — Institutional Professional Consult (permissible substitution): Payer: 59 | Admitting: Pulmonary Disease

## 2022-08-15 DIAGNOSIS — Z0389 Encounter for observation for other suspected diseases and conditions ruled out: Secondary | ICD-10-CM | POA: Diagnosis not present

## 2022-09-03 ENCOUNTER — Encounter: Payer: Self-pay | Admitting: Family Medicine

## 2022-09-03 ENCOUNTER — Telehealth (INDEPENDENT_AMBULATORY_CARE_PROVIDER_SITE_OTHER): Payer: 59 | Admitting: Family Medicine

## 2022-09-03 DIAGNOSIS — J029 Acute pharyngitis, unspecified: Secondary | ICD-10-CM

## 2022-09-03 DIAGNOSIS — Z20822 Contact with and (suspected) exposure to covid-19: Secondary | ICD-10-CM | POA: Diagnosis not present

## 2022-09-03 MED ORDER — AMOXICILLIN 500 MG PO CAPS: 500.0000 mg | ORAL_CAPSULE | Freq: Two times a day (BID) | ORAL | 0 refills | Status: AC

## 2022-09-03 NOTE — Progress Notes (Signed)
Telephone visit  Subjective: IN:OMVEH?> PCP: Janora Norlander, DO MCN:OBSJGG Victor Best is a 67 y.o. male calls for telephone consult today. Patient provides verbal consent for consult held via phone.  Due to COVID-19 pandemic this visit was conducted virtually. This visit type was conducted due to national recommendations for restrictions regarding the COVID-19 Pandemic (e.g. social distancing, sheltering in place) in an effort to limit this patient's exposure and mitigate transmission in our community. All issues noted in this document were discussed and addressed.  A physical exam was not performed with this format.   Location of patient: home Location of provider: Office Others present for call: none  1. URI He was around his brother-in-law, who was positive for COVID.  He is worried he has strep because he has white exudates and throat is very. He reports sinusitis and watery eyes.  No shortness of breath.  Reports Tmax 101F.  He has not tested for COVID-19.  His wife is sick with similar.   ROS: Per HPI  No Known Allergies Past Medical History:  Diagnosis Date   Allergy to alpha-gal    Asthma    Mastoiditis 2004   hospitalized for this. no recurrent issues   Obesity     Current Outpatient Medications:    albuterol (VENTOLIN HFA) 108 (90 Base) MCG/ACT inhaler, Inhale 2 puffs into the lungs every 6 (six) hours as needed for wheezing or shortness of breath., Disp: 18 g, Rfl: 0   betamethasone dipropionate 0.05 % cream, Apply topically 2 (two) times daily. As needed for rash, Disp: 45 g, Rfl: 0   budesonide-formoterol (SYMBICORT) 80-4.5 MCG/ACT inhaler, Inhale 2 puffs into the lungs 2 (two) times daily., Disp: 10.3 g, Rfl: 3   fluticasone (FLONASE) 50 MCG/ACT nasal spray, Place into both nostrils daily. (Patient not taking: Reported on 06/27/2022), Disp: , Rfl:    levocetirizine (XYZAL) 5 MG tablet, Take 5 mg by mouth every evening. (Patient not taking: Reported on 06/27/2022), Disp:  , Rfl:    linaclotide (LINZESS) 145 MCG CAPS capsule, Take 1 capsule (145 mcg total) by mouth daily before breakfast. Insurance changing in August. Please put on file. (Patient not taking: Reported on 06/27/2022), Disp: 30 capsule, Rfl: 12  Assessment/ Plan: 67 y.o. male   Sore throat - Plan: COVID-19, Flu A+Victor and RSV, amoxicillin (AMOXIL) 500 MG capsule  Exposure to confirmed case of COVID-19  He declines getting tested today for COVID-19 or any of the other viruses.  I do wonder if he has had in fact a COVID infection but we will empirically treat him as a strep throat given exudative pharyngitis.  I reviewed with him risk versus benefits of not being evaluated and treated for COVID-19 including risk of hospitalization, secondary infections etc.  He understands that if he is to be treated it must be within the 5-day period of onset of symptoms.  He will follow-up as needed  Start time: 9:19am End time: 9:25am  Total time spent on patient care (including telephone call/ virtual visit): 6 minutes  [place], [place] [phone]

## 2023-04-01 ENCOUNTER — Ambulatory Visit: Payer: HMO | Admitting: Family Medicine

## 2023-04-01 ENCOUNTER — Encounter: Payer: Self-pay | Admitting: Family Medicine

## 2023-04-01 VITALS — BP 111/73 | HR 65 | Temp 97.8°F | Ht 70.0 in | Wt 186.4 lb

## 2023-04-01 DIAGNOSIS — Z125 Encounter for screening for malignant neoplasm of prostate: Secondary | ICD-10-CM

## 2023-04-01 DIAGNOSIS — J4540 Moderate persistent asthma, uncomplicated: Secondary | ICD-10-CM | POA: Diagnosis not present

## 2023-04-01 DIAGNOSIS — K5909 Other constipation: Secondary | ICD-10-CM | POA: Diagnosis not present

## 2023-04-01 DIAGNOSIS — T781XXA Other adverse food reactions, not elsewhere classified, initial encounter: Secondary | ICD-10-CM

## 2023-04-01 DIAGNOSIS — Z130 Encounter for screening for diseases of the blood and blood-forming organs and certain disorders involving the immune mechanism: Secondary | ICD-10-CM

## 2023-04-01 DIAGNOSIS — L309 Dermatitis, unspecified: Secondary | ICD-10-CM | POA: Diagnosis not present

## 2023-04-01 DIAGNOSIS — E7800 Pure hypercholesterolemia, unspecified: Secondary | ICD-10-CM

## 2023-04-01 DIAGNOSIS — Z0000 Encounter for general adult medical examination without abnormal findings: Secondary | ICD-10-CM

## 2023-04-01 DIAGNOSIS — Z0001 Encounter for general adult medical examination with abnormal findings: Secondary | ICD-10-CM | POA: Diagnosis not present

## 2023-04-01 MED ORDER — ALBUTEROL SULFATE HFA 108 (90 BASE) MCG/ACT IN AERS
2.0000 | INHALATION_SPRAY | Freq: Four times a day (QID) | RESPIRATORY_TRACT | 0 refills | Status: DC | PRN
Start: 2023-04-01 — End: 2023-08-26

## 2023-04-01 MED ORDER — FLUTICASONE PROPIONATE 50 MCG/ACT NA SUSP: 2.0000 | Freq: Every day | NASAL | 99 refills | Status: DC

## 2023-04-01 MED ORDER — EPINEPHRINE 0.3 MG/0.3ML IJ SOAJ
0.3000 mg | INTRAMUSCULAR | 0 refills | Status: AC | PRN
Start: 2023-04-01 — End: ?

## 2023-04-01 MED ORDER — LINACLOTIDE 145 MCG PO CAPS
145.0000 ug | ORAL_CAPSULE | Freq: Every day | ORAL | 3 refills | Status: DC
Start: 2023-04-01 — End: 2024-03-19

## 2023-04-01 MED ORDER — LEVOCETIRIZINE DIHYDROCHLORIDE 5 MG PO TABS
5.0000 mg | ORAL_TABLET | Freq: Every evening | ORAL | 99 refills | Status: DC | PRN
Start: 2023-04-01 — End: 2023-08-26

## 2023-04-01 MED ORDER — BETAMETHASONE DIPROPIONATE 0.05 % EX CREA
TOPICAL_CREAM | Freq: Two times a day (BID) | CUTANEOUS | 0 refills | Status: DC
Start: 2023-04-01 — End: 2023-08-26

## 2023-04-01 MED ORDER — BUDESONIDE-FORMOTEROL FUMARATE 80-4.5 MCG/ACT IN AERO
2.0000 | INHALATION_SPRAY | Freq: Two times a day (BID) | RESPIRATORY_TRACT | 3 refills | Status: DC
Start: 2023-04-01 — End: 2023-08-26

## 2023-04-01 NOTE — Progress Notes (Signed)
Victor Best is a 67 y.o. male presents to office today for annual physical exam examination.    Concerns today include: 1.  Like refills on his medications.  Continues to use betamethasone as needed rashes.  Sometimes he gets flareup of his alpha gal and this does seem to help with the rashes associated with that.  He does report recently he tried advancing to consumption of a hamburger and developed a very large rash across his body that look like he been shot with a pellet gun.  He almost went to the ER for that but waited.  He denies any shortness of breath during these episodes, nausea, vomiting or throat irritation.  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 Past Medical History:  Diagnosis Date   Allergy to alpha-gal    Asthma    Mastoiditis 2004   hospitalized for this. 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  He is married to [place] and they reside here in [place].     They have 1 son who sadly passed away after being involved in a motorcycle accident in 2020.     They have 2 grandchildren that are teenagers whom they enjoy  spending time with.   Social Determinants of Health   Financial Resource Strain: Not on file  Food Insecurity: Not on file  Transportation Needs: Not on file  Physical Activity: Not on file  Stress: Not on file  Social Connections: Unknown (12/31/2021)   Received from [facility], [facility]   Social Network    Social Network: Not on file  Intimate Partner Violence: Unknown (11/22/2021)   [facility] from [facility], [facility]   HITS    Physically Hurt: Not on file    Insult or Talk Down To: Not on file    Threaten Physical Harm: Not on file    Scream or Curse: Not on file   Past Surgical History:  Procedure Laterality Date   KNEE ARTHROSCOPY Right    TONSILLECTOMY     Family History  Problem Relation Age of Onset   Heart disease Mother    Heart attack Mother 73       cause of death   Diabetes Father    Cancer Father        brain tumor   Colon cancer Paternal Uncle    Asthma Paternal Uncle    Cancer Paternal Grandfather        ? type   Suicidality Son    Esophageal cancer  Neg Hx    Pancreatic cancer Neg Hx    Stomach cancer Neg Hx     Current Outpatient Medications:    betamethasone dipropionate 0.05 % cream, Apply topically 2 (two) times daily. As needed for rash, Disp: 45 g, Rfl: 0   albuterol (VENTOLIN HFA) 108 (90 Base) MCG/ACT inhaler, Inhale 2 puffs into the lungs every 6 (six) hours as needed for wheezing or shortness of breath. (Patient not taking: Reported on 04/01/2023), Disp: 18 g, Rfl: 0   budesonide-formoterol (SYMBICORT) 80-4.5 MCG/ACT inhaler, Inhale 2 puffs into the lungs 2 (two) times daily. (Patient not taking: Reported on 04/01/2023), Disp: 10.3 g, Rfl: 3   fluticasone (FLONASE) 50 MCG/ACT nasal spray, Place into both nostrils daily. (Patient not taking: Reported on 06/27/2022), Disp: , Rfl:    levocetirizine (XYZAL) 5 MG tablet, Take 5 mg by mouth every evening. (Patient not taking: Reported on 06/27/2022), Disp: , Rfl:    linaclotide (LINZESS)  145 MCG CAPS capsule, Take 1 capsule (145 mcg total) by mouth daily before breakfast. Insurance changing in August. Please put on file. (Patient not taking: Reported on 06/27/2022), Disp: 30 capsule, Rfl: 12  No Known Allergies   ROS: Review of Systems Pertinent items noted in HPI and remainder of comprehensive ROS otherwise negative.    Physical exam BP 111/73   Pulse 65   Temp 97.8 F (36.6 C)   Ht 5\' 10"  (1.778 m)   Wt 186 lb 6.4 oz (84.6 kg)   SpO2 97%   BMI 26.75 kg/m  General appearance: alert, cooperative, appears stated age, and no distress Head: Normocephalic, without obvious abnormality, atraumatic Eyes: negative findings: lids and lashes normal, conjunctivae and sclerae normal, corneas clear, and pupils equal, round, reactive to light and accomodation Ears: normal TM's and external ear canals both ears Nose: Nares normal. Septum midline. Mucosa normal. No drainage or sinus tenderness. Throat: lips, mucosa, and tongue normal; teeth and gums normal Neck: no adenopathy, no carotid bruit, supple, symmetrical, trachea midline, and thyroid not enlarged, symmetric, no tenderness/mass/nodules Back: symmetric, no curvature. ROM normal. No CVA tenderness. Lungs: clear to auscultation bilaterally Chest wall: no tenderness Heart: regular rate and rhythm, S1, S2 normal, no murmur, click, rub or gallop Abdomen: soft, non-tender; bowel sounds normal; no masses,  no organomegaly Extremities: extremities normal, atraumatic, no cyanosis or edema Pulses: 2+ and symmetric Skin: Skin color, texture, turgor normal. No rashes or lesions Lymph nodes: Cervical, supraclavicular, and axillary nodes normal. Neurologic: Grossly normal      04/01/2023    1:03 PM 06/27/2022    8:55 AM 05/29/2021    4:30 PM  Depression screen PHQ 2/9  Decreased Interest 0 0 0  Down, Depressed, Hopeless 0 0 0  PHQ - 2 Score 0 0 0  Altered sleeping  0   Tired, decreased energy  0   Change in appetite  0    Feeling bad or failure about yourself   0   Trouble concentrating  0   Moving slowly or fidgety/restless  0   Suicidal thoughts  0   PHQ-9 Score  0   Difficult doing work/chores  Not difficult at all     Assessment/ Plan: Steva Ready here for annual physical exam.   Annual physical exam  Pure hypercholesterolemia - Plan: TSH, Lipid panel, CMP14+EGFR  Screening, anemia, deficiency, iron - Plan: CBC  Screening for malignant neoplasm of prostate - Plan: PSA  Dermatitis - Plan: betamethasone dipropionate 0.05 % cream  Moderate  persistent asthma without complication - Plan: albuterol (VENTOLIN HFA) 108 (90 Base) MCG/ACT inhaler, budesonide-formoterol (SYMBICORT) 80-4.5 MCG/ACT inhaler  Chronic constipation - Plan: linaclotide (LINZESS) 145 MCG CAPS capsule  Allergic reaction to alpha-gal - Plan: levocetirizine (XYZAL) 5 MG tablet, EPINEPHrine (EPIPEN 2-PAK) 0.3 mg/0.3 mL IJ SOAJ injection, Alpha-Gal Panel  Fasting labs ordered.  He is not fasting currently so we will schedule lab appointment.  Screen for iron deficiency anemia, prostate but not having any prostate symptoms  Known asthma which flares up every September so I encouraged him to go ahead and start on his allergy medication, Symbicort.  He would like his alpha gal rechecked with next lab drawl so this has been preordered.  Epinephrine sent to pharmacy given reports of worsening allergic reaction after consuming a hamburger recently.  Counseled on healthy lifestyle choices, including diet (rich in fruits, vegetables and lean meats and low in salt and simple carbohydrates) and exercise (at least 30 minutes of moderate physical activity daily).  Patient to follow up 1year for CPE  Jaydis Duchene M. Nadine Counts, DO

## 2023-04-01 NOTE — Patient Instructions (Signed)
Preventive Care 65 Years and Older, Male Preventive care refers to lifestyle choices and visits with your health care provider that can promote health and wellness. Preventive care visits are also called wellness exams. What can I expect for my preventive care visit? Counseling During your preventive care visit, your health care provider may ask about your: Medical history, including: Past medical problems. Family medical history. History of falls. Current health, including: Emotional well-being. Home life and relationship well-being. Sexual activity. Memory and ability to understand (cognition). Lifestyle, including: Alcohol, nicotine or tobacco, and drug use. Access to firearms. Diet, exercise, and sleep habits. Work and work environment. Sunscreen use. Safety issues such as seatbelt and bike helmet use. Physical exam Your health care provider will check your: Height and weight. These may be used to calculate your BMI (body mass index). BMI is a measurement that tells if you are at a healthy weight. Waist circumference. This measures the distance around your waistline. This measurement also tells if you are at a healthy weight and may help predict your risk of certain diseases, such as type 2 diabetes and high blood pressure. Heart rate and blood pressure. Body temperature. Skin for abnormal spots. What immunizations do I need?  Vaccines are usually given at various ages, according to a schedule. Your health care provider will recommend vaccines for you based on your age, medical history, and lifestyle or other factors, such as travel or where you work. What tests do I need? Screening Your health care provider may recommend screening tests for certain conditions. This may include: Lipid and cholesterol levels. Diabetes screening. This is done by checking your blood sugar (glucose) after you have not eaten for a while (fasting). Hepatitis C test. Hepatitis B test. HIV (human  immunodeficiency virus) test. STI (sexually transmitted infection) testing, if you are at risk. Lung cancer screening. Colorectal cancer screening. Prostate cancer screening. Abdominal aortic aneurysm (AAA) screening. You may need this if you are a current or former smoker. Talk with your health care provider about your test results, treatment options, and if necessary, the need for more tests. Follow these instructions at home: Eating and drinking  Eat a diet that includes fresh fruits and vegetables, whole grains, lean protein, and low-fat dairy products. Limit your intake of foods with high amounts of sugar, saturated fats, and salt. Take vitamin and mineral supplements as recommended by your health care provider. Do not drink alcohol if your health care provider tells you not to drink. If you drink alcohol: Limit how much you have to 0-2 drinks a day. Know how much alcohol is in your drink. In the [place] one drink equals one 12 oz bottle of beer (355 mL), one 5 oz glass of wine (148 mL), or one 1 oz glass of hard liquor (44 mL). Lifestyle Brush your teeth every morning and night with fluoride toothpaste. Floss one time each day. Exercise for at least 30 minutes 5 or more days each week. Do not use any products that contain nicotine or tobacco. These products include cigarettes, chewing tobacco, and vaping devices, such as e-cigarettes. If you need help quitting, ask your health care provider. Do not use drugs. If you are sexually active, practice safe sex. Use a condom or other form of protection to prevent STIs. Take aspirin only as told by your health care provider. Make sure that you understand how much to take and what form to take. Work with your health care provider to find out whether it is safe   and beneficial for you to take aspirin daily. Ask your health care provider if you need to take a cholesterol-lowering medicine (statin). Find healthy ways to manage stress, such  as: Meditation, yoga, or listening to music. Journaling. Talking to a trusted person. Spending time with friends and family. Safety Always wear your seat belt while driving or riding in a vehicle. Do not drive: If you have been drinking alcohol. Do not ride with someone who has been drinking. When you are tired or distracted. While texting. If you have been using any mind-altering substances or drugs. Wear a helmet and other protective equipment during sports activities. If you have firearms in your house, make sure you follow all gun safety procedures. Minimize exposure to UV radiation to reduce your risk of skin cancer. What's next? Visit your health care provider once a year for an annual wellness visit. Ask your health care provider how often you should have your eyes and teeth checked. Stay up to date on all vaccines. This information is not intended to replace advice given to you by your health care provider. Make sure you discuss any questions you have with your health care provider. Document Revised: 01/31/2021 Document Reviewed: 01/31/2021 Elsevier Patient Education  2024 [employer] Inc.  

## 2023-04-03 ENCOUNTER — Other Ambulatory Visit: Payer: HMO

## 2023-04-03 DIAGNOSIS — E7800 Pure hypercholesterolemia, unspecified: Secondary | ICD-10-CM

## 2023-04-03 DIAGNOSIS — T781XXA Other adverse food reactions, not elsewhere classified, initial encounter: Secondary | ICD-10-CM | POA: Diagnosis not present

## 2023-04-03 DIAGNOSIS — Z130 Encounter for screening for diseases of the blood and blood-forming organs and certain disorders involving the immune mechanism: Secondary | ICD-10-CM

## 2023-04-03 DIAGNOSIS — Z125 Encounter for screening for malignant neoplasm of prostate: Secondary | ICD-10-CM | POA: Diagnosis not present

## 2023-04-07 LAB — CBC
Hematocrit: 44.8 % (ref 37.5–51.0)
Hemoglobin: 14.8 g/dL (ref 13.0–17.7)
MCH: 30 pg (ref 26.6–33.0)
MCHC: 33 g/dL (ref 31.5–35.7)
MCV: 91 fL (ref 79–97)
Platelets: 197 10*3/uL (ref 150–450)
RBC: 4.93 x10E6/uL (ref 4.14–5.80)
RDW: 12.9 % (ref 11.6–15.4)
WBC: 6.1 10*3/uL (ref 3.4–10.8)

## 2023-04-07 LAB — ALPHA-GAL PANEL
Allergen Lamb IgE: 3.79 kU/L — AB
Beef IgE: 5.27 kU/L — AB
IgE (Immunoglobulin E), Serum: 121 [IU]/mL (ref 6–495)
O215-IgE Alpha-Gal: 13.8 kU/L — AB
Pork IgE: 3.24 kU/L — AB

## 2023-04-07 LAB — CMP14+EGFR
ALT: 10 IU/L (ref 0–44)
AST: 16 IU/L (ref 0–40)
Albumin: 4.3 g/dL (ref 3.9–4.9)
Alkaline Phosphatase: 64 IU/L (ref 44–121)
BUN/Creatinine Ratio: 18 (ref 10–24)
BUN: 16 mg/dL (ref 8–27)
Bilirubin Total: 0.5 mg/dL (ref 0.0–1.2)
CO2: 25 mmol/L (ref 20–29)
Calcium: 9.5 mg/dL (ref 8.6–10.2)
Chloride: 102 mmol/L (ref 96–106)
Creatinine, Ser: 0.88 mg/dL (ref 0.76–1.27)
Globulin, Total: 2.4 g/dL (ref 1.5–4.5)
Glucose: 82 mg/dL (ref 70–99)
Potassium: 4.2 mmol/L (ref 3.5–5.2)
Sodium: 142 mmol/L (ref 134–144)
Total Protein: 6.7 g/dL (ref 6.0–8.5)
eGFR: 95 mL/min/{1.73_m2} (ref >59)

## 2023-04-07 LAB — LIPID PANEL
Chol/HDL Ratio: 4.5 ratio (ref 0.0–5.0)
Cholesterol, Total: 229 mg/dL — ABNORMAL HIGH (ref 100–199)
HDL: 51 mg/dL (ref >39)
LDL Chol Calc (NIH): 165 mg/dL — ABNORMAL HIGH (ref 0–99)
Triglycerides: 77 mg/dL (ref 0–149)
VLDL Cholesterol Cal: 13 mg/dL (ref 5–40)

## 2023-04-07 LAB — TSH: TSH: 1.74 u[IU]/mL (ref 0.450–4.500)

## 2023-04-07 LAB — PSA: Prostate Specific Ag, Serum: 1.5 ng/mL (ref 0.0–4.0)

## 2023-04-08 ENCOUNTER — Other Ambulatory Visit: Payer: HMO

## 2023-08-25 ENCOUNTER — Ambulatory Visit: Payer: HMO | Admitting: Family Medicine

## 2023-08-26 ENCOUNTER — Encounter: Payer: Self-pay | Admitting: Nurse Practitioner

## 2023-08-26 ENCOUNTER — Ambulatory Visit: Payer: HMO | Admitting: Nurse Practitioner

## 2023-08-26 VITALS — BP 137/82 | HR 54 | Temp 97.4°F | Ht 70.0 in | Wt 184.0 lb

## 2023-08-26 DIAGNOSIS — K5909 Other constipation: Secondary | ICD-10-CM

## 2023-08-26 DIAGNOSIS — L309 Dermatitis, unspecified: Secondary | ICD-10-CM | POA: Diagnosis not present

## 2023-08-26 MED ORDER — LUBIPROSTONE 24 MCG PO CAPS
24.0000 ug | ORAL_CAPSULE | Freq: Two times a day (BID) | ORAL | 5 refills | Status: DC
Start: 2023-08-26 — End: 2024-03-19

## 2023-08-26 MED ORDER — BETAMETHASONE DIPROPIONATE 0.05 % EX CREA: TOPICAL_CREAM | Freq: Two times a day (BID) | CUTANEOUS | 1 refills | Status: DC

## 2023-08-26 NOTE — Patient Instructions (Signed)

## 2023-08-26 NOTE — Progress Notes (Signed)
   Subjective:    Patient ID: Victor Best, male    DOB: 1955-10-14, 68 y.o.   MRN: [id number]   Chief Complaint: Constipation   Patient has a long history of constipation. He can go up to 14 days with no bowel movement. He has been in  linzess  and that helped for awhile but gradually quit working. He stopped talking linzess  several months ago. He eats high fiber diet, drinks castrol oil and miralax .   Constipation This is a chronic problem. The problem has been gradually worsening since onset. His stool frequency is 1 time per week or less. The stool is described as blood coated and firm. The patient is not on a high fiber diet. He Does not exercise regularly. There has Been adequate water intake. Associated symptoms include bloating. Pertinent negatives include no abdominal pain.     Patient Active Problem List   Diagnosis Date Noted   Pure hypercholesterolemia 03/15/2022   Chronic constipation 03/15/2022   History of arthroscopic knee surgery 03/15/2022   Chronic pain of right knee 03/15/2022   Dermatitis 03/15/2022   Bloating 02/23/2021   Change in bowel habits 02/06/2021   Cough 09/30/2013   Asthma with acute exacerbation 09/16/2013   Asthma    Obesity        Review of Systems  Gastrointestinal:  Positive for bloating and constipation. Negative for abdominal pain.       Objective:   Physical Exam Constitutional:      Appearance: Normal appearance.  Cardiovascular:     Rate and Rhythm: Normal rate and regular rhythm.     Heart sounds: Normal heart sounds.  Pulmonary:     Effort: Pulmonary effort is normal.     Breath sounds: Normal breath sounds.  Abdominal:     General: Abdomen is flat. Bowel sounds are normal.     Tenderness: There is no abdominal tenderness. There is no guarding.  Skin:    General: Skin is warm.  Neurological:     General: No focal deficit present.     Mental Status: He is alert and oriented to person, place, and time.  Psychiatric:         Mood and Affect: Mood normal.        Behavior: Behavior normal.    BP 137/82   Pulse (!) 54   Temp (!) 97.4 F (36.3 C) (Temporal)   Ht 5' 10 (1.778 m)   Wt 184 lb (83.5 kg)   SpO2 99%   BMI 26.40 kg/m         Assessment & Plan:   Victor Best in today with chief complaint of Constipation   1. Chronic constipation (Primary) Added amitiza  bid Miralax  daily in apple juice Continue  to eat high fiber diet - lubiprostone  (AMITIZA ) 24 MCG capsule; Take 1 capsule (24 mcg total) by mouth 2 (two) times daily with a meal.  Dispense: 60 capsule; Refill: 5    The above assessment and management plan was discussed with the patient. The patient verbalized understanding of and has agreed to the management plan. Patient is aware to call the clinic if symptoms persist or worsen. Patient is aware when to return to the clinic for a follow-up visit. Patient educated on when it is appropriate to go to the emergency department.   Victor Gladis, FNP

## 2023-09-15 DIAGNOSIS — H5213 Myopia, bilateral: Secondary | ICD-10-CM | POA: Diagnosis not present

## 2024-01-08 ENCOUNTER — Other Ambulatory Visit: Payer: Self-pay | Admitting: Family Medicine

## 2024-01-08 DIAGNOSIS — T781XXA Other adverse food reactions, not elsewhere classified, initial encounter: Secondary | ICD-10-CM

## 2024-01-08 DIAGNOSIS — J4540 Moderate persistent asthma, uncomplicated: Secondary | ICD-10-CM

## 2024-02-13 ENCOUNTER — Ambulatory Visit: Payer: Self-pay

## 2024-02-13 DIAGNOSIS — T148XXA Other injury of unspecified body region, initial encounter: Secondary | ICD-10-CM | POA: Diagnosis not present

## 2024-02-13 DIAGNOSIS — R509 Fever, unspecified: Secondary | ICD-10-CM | POA: Diagnosis not present

## 2024-02-13 DIAGNOSIS — Z20822 Contact with and (suspected) exposure to covid-19: Secondary | ICD-10-CM | POA: Diagnosis not present

## 2024-02-13 NOTE — Telephone Encounter (Signed)
 FYI Only or Action Required?: FYI only for provider.  Patient was last seen in primary care on 08/26/2023 by Gladis Mustard, FNP. Called Nurse Triage reporting Tick Removal. Symptoms began several days ago. Interventions attempted: Nothing. Symptoms are: gradually worsening.  Triage Disposition: See HCP Within 4 Hours (Or PCP Triage)  Patient/caregiver understands and will follow disposition?: Unsure  Copied from CRM [id number]. Topic: Clinical - Medical Advice >> Feb 13, 2024 12:13 PM Myrick T wrote: Reason for CRM: patients wife Olam called stated patient has several tick bites, nect stiffness, body aches, headaches and joint pain. Please f/u with patients wife Reason for Disposition  [1] Fever AND [2] spreading red area or streak  Answer Assessment - Initial Assessment Questions 1. ATTACHED:  Is the tick still on the skin?  (e.g., yes, no, unsure)     Removed several 2. ONSET - TICK STILL ATTACHED:  How long do you think the tick has been on your skin? (e.g., hours, days, unsure)  Note:  Is there a recent activity (camping, hiking) where the caller may have been exposed?     No 3. ONSET - TICK NOT STILL ATTACHED: If the tick has been removed, how long do you think the tick was attached before you removed it? (e.g., 5 hours, 2 days). When was this?     multiple 4. LOCATION: Where is the tick bite located? (e.g., arm, leg)     Multiple locations  5. ENGORGED: Did the tick look flat or engorged (full, swollen)? (e.g., flat, engorged; unsure)     yes 6. OTHER SYMPTOMS: Do you have any other symptoms? (e.g., fever, rash, redness at bite area, red ring around bite)     Fever 102.0, stiff neck, headache  Protocols used: Tick Bite-A-AH

## 2024-02-14 ENCOUNTER — Other Ambulatory Visit: Payer: Self-pay

## 2024-02-14 DIAGNOSIS — J45909 Unspecified asthma, uncomplicated: Secondary | ICD-10-CM | POA: Diagnosis not present

## 2024-02-14 DIAGNOSIS — R509 Fever, unspecified: Secondary | ICD-10-CM | POA: Diagnosis not present

## 2024-02-14 LAB — URINALYSIS, W/ REFLEX TO CULTURE (INFECTION SUSPECTED)
Bilirubin Urine: NEGATIVE
Glucose, UA: NEGATIVE mg/dL
Ketones, ur: NEGATIVE mg/dL
Leukocytes,Ua: NEGATIVE
Nitrite: NEGATIVE
Protein, ur: 30 mg/dL — AB
Specific Gravity, Urine: 1.027 (ref 1.005–1.030)
pH: 5.5 (ref 5.0–8.0)

## 2024-02-14 LAB — COMPREHENSIVE METABOLIC PANEL WITH GFR
ALT: 39 U/L (ref 0–44)
AST: 39 U/L (ref 15–41)
Albumin: 4 g/dL (ref 3.5–5.0)
Alkaline Phosphatase: 72 U/L (ref 38–126)
Anion gap: 9 (ref 5–15)
BUN: 16 mg/dL (ref 8–23)
CO2: 28 mmol/L (ref 22–32)
Calcium: 9.5 mg/dL (ref 8.9–10.3)
Chloride: 101 mmol/L (ref 98–111)
Creatinine, Ser: 1.12 mg/dL (ref 0.61–1.24)
GFR, Estimated: >60 mL/min (ref >60)
Glucose, Bld: 107 mg/dL — ABNORMAL HIGH (ref 70–99)
Potassium: 4.2 mmol/L (ref 3.5–5.1)
Sodium: 138 mmol/L (ref 135–145)
Total Bilirubin: 0.3 mg/dL (ref 0.0–1.2)
Total Protein: 7.4 g/dL (ref 6.5–8.1)

## 2024-02-14 LAB — CBC WITH DIFFERENTIAL/PLATELET
Abs Immature Granulocytes: 0.01 10*3/uL (ref 0.00–0.07)
Basophils Absolute: 0.1 10*3/uL (ref 0.0–0.1)
Basophils Relative: 1 %
Eosinophils Absolute: 0 10*3/uL (ref 0.0–0.5)
Eosinophils Relative: 1 %
HCT: 48.5 % (ref 39.0–52.0)
Hemoglobin: 16.3 g/dL (ref 13.0–17.0)
Immature Granulocytes: 0 %
Lymphocytes Relative: 20 %
Lymphs Abs: 1.1 10*3/uL (ref 0.7–4.0)
MCH: 30.6 pg (ref 26.0–34.0)
MCHC: 33.6 g/dL (ref 30.0–36.0)
MCV: 91 fL (ref 80.0–100.0)
Monocytes Absolute: 0.8 10*3/uL (ref 0.1–1.0)
Monocytes Relative: 14 %
Neutro Abs: 3.5 10*3/uL (ref 1.7–7.7)
Neutrophils Relative %: 64 %
Platelets: 141 10*3/uL — ABNORMAL LOW (ref 150–400)
RBC: 5.33 MIL/uL (ref 4.22–5.81)
RDW: 13.2 % (ref 11.5–15.5)
WBC: 5.4 10*3/uL (ref 4.0–10.5)
nRBC: 0 % (ref 0.0–0.2)

## 2024-02-14 NOTE — ED Triage Notes (Signed)
 Pt to ED for uncontrollable fevers since Wednesday morning. Tmax103.6 treated with excedrin and motrin around the clock with some relief but continues to keep going up Pt also reports L arm and neck stiffness, HA and being bit by a tick on L lower back being treated with doxycycline from UC since yesterday. Denies sick contacts. Neg covid/flu/rsv yesterday.

## 2024-02-15 ENCOUNTER — Emergency Department (HOSPITAL_BASED_OUTPATIENT_CLINIC_OR_DEPARTMENT_OTHER)
Admission: EM | Admit: 2024-02-15 | Discharge: 2024-02-15 | Disposition: A | Discharge status: Home / Self Care | Attending: Emergency Medicine | Admitting: Emergency Medicine

## 2024-02-15 DIAGNOSIS — R509 Fever, unspecified: Secondary | ICD-10-CM

## 2024-02-15 NOTE — Discharge Instructions (Signed)
 Take Tylenol  1000 mg rotated with ibuprofen 600 mg every 4 hours as needed for fever.  Return to the ER if you develop any new and/or concerning issues.

## 2024-02-15 NOTE — ED Provider Notes (Signed)
 [facility] AT [facility] Provider Note   CSN: [id number] Arrival date & time: 02/14/24  1943     Patient presents with: Fever, Torticollis, and Arm Pain (Left/)   Victor Best is a 68 y.o. male.   Patient is a 68 year old male with past medical history of hyperlipidemia and asthma.  Patient presenting today with complaints of fever.  He has noticed temperature to 103 that is occurred at night over the past several days.  He has no symptoms otherwise to explain this.  He does have some pain in his neck, however this is not new.  He denies any cough, urinary symptoms, shortness of breath, abdominal pain, diarrhea, or other issue.  He has been taking Tylenol  with some improvement in his fever.  He was seen in urgent care yesterday and had a negative COVID/flu/RSV.  He does report recent tick bites and was prescribed doxycycline by urgent care.       Prior to Admission medications   Medication Sig Start Date End Date Taking? Authorizing Provider  betamethasone  dipropionate 0.05 % cream Apply topically 2 (two) times daily. As needed for rash 08/26/23   Gladis Mustard, FNP  EPINEPHrine  (EPIPEN  2-PAK) 0.3 mg/0.3 mL IJ SOAJ injection Inject 0.3 mg into the muscle as needed for anaphylaxis. Patient not taking: Reported on 08/26/2023 04/01/23   Jolinda Norene HERO, DO  linaclotide  (LINZESS ) 145 MCG CAPS capsule Take 1 capsule (145 mcg total) by mouth daily before breakfast. Patient not taking: Reported on 08/26/2023 04/01/23   Jolinda Norene HERO, DO  lubiprostone  (AMITIZA ) 24 MCG capsule Take 1 capsule (24 mcg total) by mouth 2 (two) times daily with a meal. 08/26/23   Gladis Mustard, FNP    Allergies: Patient has no known allergies.    Review of Systems  All other systems reviewed and are negative.   Updated Vital Signs BP 120/81   Pulse 84   Temp 99.1 F (37.3 C) (Oral)   Resp 17   Ht 5' 10 (1.778 m)   Wt 83.9 kg   SpO2 97%   BMI 26.54 kg/m    Physical Exam Vitals and nursing note reviewed.  Constitutional:      General: He is not in acute distress.    Appearance: He is well-developed. He is not diaphoretic.  HENT:     Head: Normocephalic and atraumatic.     Mouth/Throat:     Mouth: Mucous membranes are moist.   Cardiovascular:     Rate and Rhythm: Normal rate and regular rhythm.     Heart sounds: No murmur heard.    No friction rub.  Pulmonary:     Effort: Pulmonary effort is normal. No respiratory distress.     Breath sounds: Normal breath sounds. No wheezing or rales.  Abdominal:     General: Bowel sounds are normal. There is no distension.     Palpations: Abdomen is soft.     Tenderness: There is no abdominal tenderness.   Musculoskeletal:        General: Normal range of motion.     Cervical back: Normal range of motion and neck supple.   Skin:    General: Skin is warm and dry.   Neurological:     Mental Status: He is alert and oriented to person, place, and time.     Coordination: Coordination normal.     (all labs ordered are listed, but only abnormal results are displayed) Labs Reviewed  COMPREHENSIVE METABOLIC PANEL WITH GFR -  Abnormal; Notable for the following components:      Result Value   Glucose, Bld 107 (*)    All other components within normal limits  CBC WITH DIFFERENTIAL/PLATELET - Abnormal; Notable for the following components:   Platelets 141 (*)    All other components within normal limits  URINALYSIS, W/ REFLEX TO CULTURE (INFECTION SUSPECTED) - Abnormal; Notable for the following components:   Hgb urine dipstick SMALL (*)    Protein, ur 30 (*)    Bacteria, UA RARE (*)    All other components within normal limits    EKG: None  Radiology: No results found.   Procedures   Medications Ordered in the ED - No data to display                                  Medical Decision Making Amount and/or Complexity of Data Reviewed Labs: ordered.   Patient presenting with  nighttime fevers as described in the HPI.  Patient arrives here with stable vital signs and is afebrile.  Physical examination is unremarkable.  Laboratory studies obtained including CBC, metabolic panel, and urinalysis, all of which are unremarkable.  Cause of patient's fever unclear, however he has no other symptoms to pursue.  At this point, I have advised patient to continue his doxycycline and see if this helps.  To return as needed if symptoms worsen or change.     Final diagnoses:  None    ED Discharge Orders     None          Geroldine Berg, MD 02/15/24 [phone]

## 2024-02-15 NOTE — ED Notes (Signed)
 Pt seen in room 15 for low SATS of 89%. Pt placed on 2lt [place] for SATS of 99%. No distress noted

## 2024-02-19 ENCOUNTER — Ambulatory Visit: Admitting: Family Medicine

## 2024-03-17 ENCOUNTER — Ambulatory Visit: Payer: Self-pay

## 2024-03-17 NOTE — Telephone Encounter (Signed)
 Second attempt: LVM for patient to return call to [phone]    Message from Star R sent at 03/17/2024  8:49 AM EDT      Summary: Rash    Patient has a rash on both of his sides. Tried Cortizone and benadryl and has not gone away. Wife Olam would like him to see Dr Jolinda since he primarily sees her.  Patient can be reached at [phone]

## 2024-03-17 NOTE — Telephone Encounter (Signed)
 First attempt: LVM for patient to return call to [phone]   Message from [place] R sent at 03/17/2024  8:49 AM EDT  Summary: Rash   Patient has a rash on both of his sides. Tried Cortizone and benadryl and has not gone away. Wife Olam would like him to see Dr Jolinda since he primarily sees her.  Patient can be reached at [phone]

## 2024-03-17 NOTE — Telephone Encounter (Signed)
   FYI Only or Action Required?: FYI only for provider.  Patient was last seen in primary care on 08/26/2023 by Gladis Mustard, FNP.  Called Nurse Triage reporting No chief complaint on file..  Symptoms began several days ago.  Interventions attempted: Nothing.  Symptoms are: unchanged.  Triage Disposition: See PCP When Office is Open (Within 3 Days)  Patient/caregiver understands and will follow disposition?: Yes Reason for Disposition  Mild widespread rash  (Exception: Heat rash lasting 3 days or less.)  Answer Assessment - Initial Assessment Questions 1. APPEARANCE of RASH: What does the rash look like? (e.g., blisters, dry flaky skin, red spots, redness, sores)     Red hives, no bumps 2. SIZE: How big are the spots? (e.g., tip of pen, eraser, coin; inches, centimeters)     Smaller than a coin 3. LOCATION: Where is the rash located?     Both sided 4. COLOR: What color is the rash? (Note: It is difficult to assess rash color in people with darker-colored skin. When this situation occurs, simply ask the caller to describe what they see.)     red 5. ONSET: When did the rash begin?     Two days ago 6. FEVER: Do you have a fever? If Yes, ask: What is your temperature, how was it measured, and when did it start?     no 7. ITCHING: Does the rash itch? If Yes, ask: How bad is the itch? (Scale 1-10; or mild, moderate, severe)     Not today 8. CAUSE: What do you think is causing the rash?     Unknown. 9. MEDICINE FACTORS: Have you started any new medicines within the last 2 weeks? (e.g., antibiotics)      no 10. OTHER SYMPTOMS: Do you have any other symptoms? (e.g., dizziness, headache, sore throat, joint pain)       no 11. PREGNANCY: Is there any chance you are pregnant? When was your last menstrual period?       na  Protocols used: Rash or Redness - [facility]

## 2024-03-17 NOTE — Telephone Encounter (Signed)
 noted

## 2024-03-19 ENCOUNTER — Ambulatory Visit (INDEPENDENT_AMBULATORY_CARE_PROVIDER_SITE_OTHER): Admitting: Family Medicine

## 2024-03-19 ENCOUNTER — Ambulatory Visit (INDEPENDENT_AMBULATORY_CARE_PROVIDER_SITE_OTHER)

## 2024-03-19 ENCOUNTER — Ambulatory Visit: Payer: Self-pay | Admitting: Family Medicine

## 2024-03-19 ENCOUNTER — Encounter: Payer: Self-pay | Admitting: Family Medicine

## 2024-03-19 VITALS — BP 127/83 | HR 60 | Temp 97.4°F | Ht 70.0 in | Wt 186.4 lb

## 2024-03-19 DIAGNOSIS — R0602 Shortness of breath: Secondary | ICD-10-CM | POA: Diagnosis not present

## 2024-03-19 DIAGNOSIS — Z77098 Contact with and (suspected) exposure to other hazardous, chiefly nonmedicinal, chemicals: Secondary | ICD-10-CM | POA: Diagnosis not present

## 2024-03-19 DIAGNOSIS — Z77090 Contact with and (suspected) exposure to asbestos: Secondary | ICD-10-CM | POA: Diagnosis not present

## 2024-03-19 DIAGNOSIS — M67912 Unspecified disorder of synovium and tendon, left shoulder: Secondary | ICD-10-CM | POA: Diagnosis not present

## 2024-03-19 DIAGNOSIS — R0601 Orthopnea: Secondary | ICD-10-CM

## 2024-03-19 DIAGNOSIS — K5909 Other constipation: Secondary | ICD-10-CM | POA: Diagnosis not present

## 2024-03-19 MED ORDER — TRULANCE 3 MG PO TABS: 1.0000 | ORAL_TABLET | Freq: Every day | ORAL | 3 refills | Status: DC

## 2024-03-19 NOTE — Patient Instructions (Signed)
 Rotator Cuff Tear: What to Know  A rotator cuff tear is a partial or full tear of the tissue called tendons that connect muscle to bone in the rotator cuff. The rotator cuff is a group of muscles and tendons that surround the shoulder joint. They keep the upper arm bone, called the humerus, in the shoulder socket. A tear can be acute, which means it happens all of a sudden. It can also be chronic, which means it forms slowly over time. What are the causes? Acute tears can happen if: You fall. They may happen if you stretch your arm to brace your fall. You lift very heavy objects with a jerking motion. Chronic tears can happen if you use your shoulder too much. This may happen from: Sports. Physical work. Doing things that make you move your arm over your head a lot. What increases the risk? You're more likely to get a rotator cuff tear if: You play sports. You do work that uses your shoulder a lot. You smoke. You're older or have arthritis. What are the signs or symptoms? If you have an acute tear, you may: Feel a sudden tearing. Have very bad pain that goes from your shoulder down your arm. If you have a chronic tear, you may have: Weakness in your shoulder. Less movement in your shoulder. Pain that gets worse at night. Both types may have: Pain that spreads from your shoulder to your upper arm. Swelling and tenderness in front of your shoulder. Trouble moving your arm, such as when: You reach or lift your arm above your head. You lower it from above your head. You raise it out to the side. You put it behind your back. How is this diagnosed? Your health care provider will check your medical history and do an exam. You may also need tests, such as: X-rays. An MRI. An ultrasound. A CT or MR arthrogram. During this test, a contrast dye is put in your shoulder, and then pictures are taken. How is this treated? Treatment depends on how bad the tear is. If the tear is mild, you  may need to: Rest your shoulder. This may be done with a sling or a device called an immobilizer that will keep your arm and shoulder still. You may also need to avoid lifting your arm. Ice the shoulder. Take medicines to help with pain and swelling. Do exercises to help you move and make your shoulder strong. Do physical therapy. For worse tears, treatment may include: Steroid shots. Surgery. Follow these instructions at home: Managing pain, stiffness, and swelling     Use ice or an ice pack as told. If you have a sling that you can take off, remove it only as told. Place a towel between your skin and the ice. Leave the ice on for 20 minutes, 2-3 times a day. Move your fingers often to reduce stiffness and swelling. Raise your arm above the level of your heart while you're sitting or lying down. Find a comfortable way to sleep. You may want to try to sleep sitting upright. Once the swelling has gone down, you may be told to use heat to relax your muscles. Use the heat source that your provider recommends, such as a moist heat pack or a heating pad. Do this as often as told. Place a towel between your skin and the heat source. Leave the heat on for 20-30 minutes. If your skin turns red, take off the ice or heat right away to prevent  skin damage. The risk of damage is higher if you can't feel pain, heat, or cold. If you have a sling or immobilizer: Wear the sling or immobilizer as told. Take it off only if your provider says you can. Check the skin around it every day. Tell your provider if you see problems. Loosen the sling or immobilizer if your fingers tingle, are numb, or turn cold and blue. Keep the sling or immobilizer clean. If it's not waterproof: Do not let it get wet. Cover it when you take a bath or shower. Use a cover that doesn't let any water in. Activity Rest your shoulder as told. Exercise as told. Ask when it's safe to drive if you have a sling or immobilizer. Ask  what things are safe for you to do at home. Ask when you can go back to work or school. General instructions Take your medicines only as told. Do not smoke, vape, or use nicotine or tobacco. Keep all follow-up visits. Your provider will check how your shoulder is healing. Contact a health care provider if: Your pain gets worse. You have new pain in your arm, hands, or fingers. Your pain doesn't get better with medicine. Get help right away if: Your arm, hand, or fingers are: Numb. Tingling. Swollen. Painful. White or blue. Your hand or fingers on your injured arm are colder than your other hand. This information is not intended to replace advice given to you by your health care provider. Make sure you discuss any questions you have with your health care provider. Document Revised: 07/08/2023 Document Reviewed: 07/08/2023 Elsevier Patient Education  2025 [employer].

## 2024-03-19 NOTE — Progress Notes (Signed)
 Subjective: CC: left arm, bathroom problems PCP: Victor Norene HERO, DO YEP:[place] is a 68 y.o. male presenting to clinic today for:  1.  Left shoulder pain Patient reports that he has had issues with his left shoulder for about 6 months now.  He denies any preceding injury.  He is not able to lift his left upper extremity up independently and relies on his right arm to lift that arm due to pain.  He reports no sensory changes.  He is right-hand dominant.  2.  Constipation This is been a chronic issue for him.  He has been treated with samples of Linzess  up to 290 mcg which were not helpful long-term.  It only helped a little bit for a very short period.  He was placed on Amitiza  recently and that made no difference whatsoever.  He has used MiraLAX  which again are not helping.  He will go up to 7 days without a bowel movement and then has to use Dulcolax suppositories in efforts to have a bowel movement.  He reports no blood in stool.  No nausea, vomiting reported.  Last colonoscopy was in June 2022 and he reports that he discussed this with his gastroenterologist but no further recommendations were made  3.  Shortness of breath with lying down He reports shortness of breath with lying down and this has been ongoing for about 2 to 3 years.  He denies any hemoptysis, unplanned weight loss, night sweats, chest pain.  He has never been a smoker but has had best dust exposure as well as multiple chemical exposures over his lifetime.  He is self-employed.  Never seen pulmonology for this.  Denies any lower extremity swelling or chest pain or intolerance to activity.  No shortness of breath with exertion   ROS: Per HPI  No Known Allergies Past Medical History:  Diagnosis Date   Allergy  to alpha-gal    Asthma    Mastoiditis 2004   hospitalized for this. no recurrent issues   Obesity     Current Outpatient Medications:    betamethasone  dipropionate 0.05 % cream, Apply topically 2  (two) times daily. As needed for rash, Disp: 45 g, Rfl: 1   EPINEPHrine  (EPIPEN  2-PAK) 0.3 mg/0.3 mL IJ SOAJ injection, Inject 0.3 mg into the muscle as needed for anaphylaxis. (Patient not taking: Reported on 08/26/2023), Disp: 2 each, Rfl: 0   linaclotide  (LINZESS ) 145 MCG CAPS capsule, Take 1 capsule (145 mcg total) by mouth daily before breakfast. (Patient not taking: Reported on 08/26/2023), Disp: 90 capsule, Rfl: 3   lubiprostone  (AMITIZA ) 24 MCG capsule, Take 1 capsule (24 mcg total) by mouth 2 (two) times daily with a meal., Disp: 60 capsule, Rfl: 5 Social History   Socioeconomic History   Marital status: Married    Spouse name: [place]   Number of children: 1   Years of education: Not on file   Highest education level: Not on file  Occupational History   Occupation: Chartered certified accountant  Tobacco Use   Smoking status: Never   Smokeless tobacco: Never  Vaping Use   Vaping status: Never Used  Substance and Sexual Activity   Alcohol use: No   Drug use: No   Sexual activity: Not on file  Other Topics Concern   Not on file  Social History Narrative   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  He is married to [place] and they reside here in [place].  They have 1 son who sadly passed away after being involved in a motorcycle accident in 2020.     They have 2 grandchildren that are teenagers whom they enjoy spending time with.   Social Drivers of Corporate investment banker Strain: Not on file  Food Insecurity: Not on file  Transportation Needs: Not on file  Physical Activity: Not on file  Stress: Not on file  Social Connections: Unknown (12/31/2021)   Received from [facility]   Social Network    Social Network: Not on file  Intimate Partner Violence: Unknown (11/22/2021)   Received from [facility]   HITS    Physically Hurt: Not on file    Insult or Talk Down To: Not on file    Threaten Physical Harm: Not on file     Scream or Curse: Not on file   Family History  Problem Relation Age of Onset   Heart disease Mother    Heart attack Mother 58       cause of death   Diabetes Father    Cancer Father        brain tumor   Colon cancer Paternal Uncle    Asthma Paternal Uncle    Cancer Paternal Grandfather        ? type   Suicidality Son    Esophageal cancer Neg Hx    Pancreatic cancer Neg Hx    Stomach cancer Neg Hx     Objective: Office vital signs reviewed. BP 127/83   Pulse 60   Temp (!) 97.4 F (36.3 C)   Ht 5' 10 (1.778 m)   Wt 186 lb 6 oz (84.5 kg)   SpO2 93%   BMI 26.74 kg/m   Physical Examination:  General: Awake, alert, well-appearing male, No acute distress HEENT: Sclera white.  Moist mucous membranes Cardio: regular rate and rhythm, S1S2 heard, no murmurs appreciated Pulm: clear to auscultation bilaterally, no wheezes, rhonchi or rales; normal work of breathing on room air GI: Protuberant  Extremities: warm, well perfused, No edema, cyanosis or clubbing  MSK:   Left shoulder: Active range of motion present in flexion but loss of active range of motion in abduction of the left shoulder.  He has full passive range of motion.  He has no tenderness palpation to the shoulder.  No palpable deformity.  Negative Hawkins maneuver.  Negative Napoleon test.  Positive empty can test on the left.   Assessment/ Plan: 68 y.o. male   Orthopnea - Plan: Ambulatory referral to Pulmonology, DG Chest 2 View  Asbestos exposure - Plan: Ambulatory referral to Pulmonology, DG Chest 2 View  Chemical exposure - Plan: Ambulatory referral to Pulmonology, DG Chest 2 View  Dysfunction of left rotator cuff - Plan: Ambulatory referral to Orthopedic Surgery  Chronic constipation - Plan: Plecanatide (TRULANCE) 3 MG TABS  No evidence of fluid overload.  Plain films ordered and I could not appreciate any pulmonary edema or abnormalities.  Radiology review confirms this.  I referred him to pulmonology for  consideration of pulmonary function testing.  Given exposure to asbestosis I do question may be some type of restrictive lung or asbestosis causing his symptoms.  He appears to have maybe a left rotator cuff tear.  I referred him to orthopedics for intervention.  I offered him referral to physical therapy but at this point I am not sure that would be very helpful.   Trial of Trulance for chronic constipation that is refractory to MiraLAX , Linzess , Amitiza .  I considered Movantik but I am not sure that his insurance will cover.  He is currently reliant on laxatives to move his bowels.  Encourage p.o. hydration, adequate fiber intake which does sound like he is doing.  If ongoing symptoms or if we cannot get medications covered on an outpatient setting we may need to have him referred back to his gastroenterologist   Norene CHRISTELLA Fielding, DO [facility] [phone]

## 2024-04-02 ENCOUNTER — Encounter: Payer: HMO | Admitting: Family Medicine

## 2024-07-26 ENCOUNTER — Ambulatory Visit: Payer: Self-pay | Admitting: Family

## 2024-07-26 ENCOUNTER — Encounter: Payer: Self-pay | Admitting: Family

## 2024-07-26 VITALS — BP 134/87 | HR 56 | Temp 97.5°F | Ht 70.0 in | Wt 185.0 lb

## 2024-07-26 DIAGNOSIS — J4520 Mild intermittent asthma, uncomplicated: Secondary | ICD-10-CM

## 2024-07-26 DIAGNOSIS — K5909 Other constipation: Secondary | ICD-10-CM

## 2024-07-26 DIAGNOSIS — L309 Dermatitis, unspecified: Secondary | ICD-10-CM

## 2024-07-26 MED ORDER — BUDESONIDE-FORMOTEROL FUMARATE 80-4.5 MCG/ACT IN AERO: 2.0000 | INHALATION_SPRAY | Freq: Two times a day (BID) | RESPIRATORY_TRACT | 2 refills | Status: DC

## 2024-07-26 MED ORDER — BETAMETHASONE DIPROPIONATE 0.05 % EX CREA: TOPICAL_CREAM | Freq: Two times a day (BID) | CUTANEOUS | 1 refills | Status: DC

## 2024-07-26 MED ORDER — LUBIPROSTONE 24 MCG PO CAPS: 24.0000 ug | ORAL_CAPSULE | Freq: Two times a day (BID) | ORAL | 2 refills | Status: DC

## 2024-07-26 NOTE — Addendum Note (Signed)
 Addended by: MICHELINE ROSINA FALCON on: 07/26/2024 08:57 AM   Modules accepted: Orders

## 2024-07-26 NOTE — Progress Notes (Signed)
 Subjective:    Patient ID: [place], male    DOB: 24-Jan-1956, 68 y.o.   MRN: [id number]  Chief Complaint  Patient presents with   Constipation   PT presents to the office today with chronic constipation. He has taken Trulance  3 mg without relief. He took Linzess  145 mg for a year with mild relief.   Reports now he has not taken anything and will go 8 days without a BM then get sick. He will then take 3 ex lax, 3 dulcolax, and one bottle of mag citrate and then have a several large BM.  Also, requesting his steroid cream be refilled today for dermatitis that comes and goes.  Constipation This is a chronic problem. The current episode started more than 1 year ago. The problem is unchanged. His stool frequency is 1 time per week or less. Associated symptoms include bloating and nausea. Pertinent negatives include no diarrhea, difficulty urinating, flatus, hematochezia, hemorrhoids, rectal pain, vomiting or weight loss. He has tried laxatives and diet changes for the symptoms. The treatment provided mild relief.  Asthma There is no cough, shortness of breath or wheezing. This is a chronic problem. The current episode started more than 1 year ago. The problem occurs intermittently. Pertinent negatives include no weight loss. His symptoms are alleviated by rest. His past medical history is significant for asthma.      Review of Systems  Constitutional:  Negative for weight loss.  Respiratory:  Negative for cough, shortness of breath and wheezing.   Gastrointestinal:  Positive for bloating, constipation and nausea. Negative for diarrhea, flatus, hematochezia, hemorrhoids, rectal pain and vomiting.  Genitourinary:  Negative for difficulty urinating.  All other systems reviewed and are negative.   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  He is married to McBride and they reside here in [place].     They have 1 son who sadly passed away after being involved in a motorcycle accident in 2020.     They have 2 grandchildren that are teenagers whom they enjoy spending time with.   Social Drivers of Corporate Investment Banker Strain: Not on file  Food Insecurity: Not on file  Transportation Needs: Not on file  Physical Activity: Not on file  Stress: Not on file  Social Connections: Unknown (12/31/2021)   Received from [facility]   Social Network    Social Network: Not on file   Family History  Problem Relation Age of Onset   Heart disease Mother    Heart attack Mother 51       cause of death   Diabetes Father    Cancer Father        brain tumor   Colon cancer Paternal Uncle    Asthma Paternal Uncle    Cancer Paternal Grandfather        ?  type   Suicidality Son    Esophageal cancer Neg Hx    Pancreatic cancer Neg Hx    Stomach cancer Neg Hx         Objective:   Physical Exam Vitals reviewed.  Constitutional:      General: He is not in acute distress.    Appearance: He is well-developed.  HENT:     Head: Normocephalic.     Right Ear: Tympanic membrane normal.     Left Ear: Tympanic membrane normal.  Eyes:     General:        Right eye: No discharge.        Left eye: No discharge.     Pupils: Pupils are equal, round, and reactive to light.  Neck:     Thyroid : No thyromegaly.  Cardiovascular:     Rate and Rhythm: Normal rate and regular rhythm.     Heart sounds: Normal heart sounds.  No murmur heard. Pulmonary:     Effort: Pulmonary effort is normal. No respiratory distress.     Breath sounds: Normal breath sounds. No wheezing.  Abdominal:     General: Bowel sounds are normal. There is no distension.     Palpations: Abdomen is soft.     Tenderness: There is no abdominal tenderness.  Musculoskeletal:        General: No tenderness. Normal range of motion.     Cervical back: Normal range of motion and neck supple.  Skin:    General: Skin is warm and dry.     Findings: No erythema or rash.  Neurological:     Mental Status: He is alert and oriented to person, place, and time.     Cranial Nerves: No cranial nerve deficit.     Deep Tendon Reflexes: Reflexes are normal and symmetric.  Psychiatric:        Behavior: Behavior normal.        Thought Content: Thought content normal.        Judgment: Judgment normal.       BP 134/87   Pulse (!) 56   Temp (!) 97.5 F (36.4 C) (Temporal)   Ht 5' 10 (1.778 m)   Wt 185 lb (83.9 kg)   BMI 26.54 kg/m      Assessment & Plan:  [place] comes in today with chief complaint of Constipation   Diagnosis and orders addressed:  1. Dermatitis Avoid scratching Keep clean and dry - betamethasone  dipropionate 0.05 % cream; Apply topically 2 (two) times daily. As needed for rash  Dispense: 45 g; Refill: 1  2. Mild intermittent asthma, unspecified whether complicated Avoid allergens  Breyna  BID  - budesonide -formoterol  (BREYNA ) 80-4.5 MCG/ACT inhaler; Inhale 2 puffs into the lungs 2 (two) times daily.  Dispense: 1 each; Refill: 2  3. Chronic constipation (Primary) Start Amitiza  BID  Force fluids High fiber diet Continue laxatives  Referral to GI, wants to see Dr Burnette. Has seen [facility] in the past.  - lubiprostone  (AMITIZA ) 24 MCG capsule; Take 1 capsule (24 mcg total) by mouth 2 (two) times daily with a meal.  Dispense: 60 capsule; Refill: 2 - Ambulatory referral to Gastroenterology      Bari Learn,  FNP

## 2024-07-26 NOTE — Patient Instructions (Signed)

## 2024-08-10 ENCOUNTER — Telehealth: Payer: Self-pay | Admitting: Family Medicine

## 2024-08-10 ENCOUNTER — Encounter: Payer: Self-pay | Admitting: Physician Assistant

## 2024-08-10 NOTE — Telephone Encounter (Signed)
 REFERRAL REQUEST Telephone Note  Have you been seen at our office for this problem? yes (Advise that they may need an appointment with their PCP before a referral can be done)  Reason for Referral: can't use the bathroom (goes 6-7 days without going) Referral discussed with patient: yes  Best contact number of patient for referral team: [phone]    Has patient been seen by a specialist for this issue before: no  Patient provider preference for referral: none Patient location preference for referral: [facility]   Patient notified that referrals can take up to a week or longer to process. If they haven't heard anything within a week they should call back and speak with the referral department.    *Pt needs an urgent referral. He can not wait until Jan 26 with [facility])  He would to get seen sooner with [facility].  Please call him ASAP!

## 2024-08-10 NOTE — Telephone Encounter (Signed)
 I called [facility]- Sherron informed me that pt was told that his medical records needed to be faxed over from [facility] to allow transfer of care. PT will need to go to [facility] to release those records to [facility]. The fax number to medical records is [phone].

## 2024-08-17 ENCOUNTER — Telehealth: Payer: Self-pay | Admitting: Family Medicine

## 2024-08-17 ENCOUNTER — Other Ambulatory Visit: Payer: Self-pay

## 2024-08-17 DIAGNOSIS — K5909 Other constipation: Secondary | ICD-10-CM

## 2024-08-17 NOTE — Telephone Encounter (Signed)
 Referral placed to [facility]. Pt notified. LS

## 2024-08-17 NOTE — Telephone Encounter (Unsigned)
 Copied from CRM [id number]. Topic: Referral - Question >> Aug 17, 2024  8:46 AM Diannia H wrote: Reason for CRM: Patient called and stated that he spoke with someone and told them that he just wants to be seen at [facility] and no longer wants to be seen at [facility]. He states that Rilla said she would send in a urgent referral. He has an appointment with [facility] but its too far out and if he has the referral they could see him sooner. Could you assist? Patients callback number is [phone].

## 2024-09-13 ENCOUNTER — Ambulatory Visit: Admitting: Physician Assistant

## 2024-09-16 ENCOUNTER — Ambulatory Visit
Admission: RE | Admit: 2024-09-16 | Discharge: 2024-09-16 | Disposition: A | Discharge status: Home / Self Care | Source: Ambulatory Visit | Attending: Gastroenterology | Admitting: Gastroenterology

## 2024-09-16 ENCOUNTER — Encounter: Payer: Self-pay | Admitting: Gastroenterology

## 2024-09-16 ENCOUNTER — Ambulatory Visit (INDEPENDENT_AMBULATORY_CARE_PROVIDER_SITE_OTHER): Admitting: Gastroenterology

## 2024-09-16 VITALS — BP 106/68 | HR 66 | Ht 69.0 in | Wt 185.4 lb

## 2024-09-16 DIAGNOSIS — Z860100 Personal history of colon polyps, unspecified: Secondary | ICD-10-CM | POA: Diagnosis not present

## 2024-09-16 DIAGNOSIS — K5904 Chronic idiopathic constipation: Secondary | ICD-10-CM

## 2024-09-16 DIAGNOSIS — R140 Abdominal distension (gaseous): Secondary | ICD-10-CM | POA: Diagnosis not present

## 2024-09-16 MED ORDER — PRUCALOPRIDE SUCCINATE 2 MG PO TABS: 2.0000 mg | ORAL_TABLET | Freq: Every day | ORAL | 2 refills | Status: DC

## 2024-09-16 NOTE — Patient Instructions (Addendum)
 Constipation Continue high fiber diet Drink plenty of fluids Day #1 Bowel purge with home routine Day #2 start new medication Motegrity  2 mg po daily in the morning with or without food  If your pharmacy is too expensive look in [facility] or [facility]  [email or web address]   -Update me in a month   Your provider has requested that you have an abdominal x ray before leaving today. Please go to the basement floor to our Radiology department for the test.  _______________________________________________________  If your blood pressure at your visit was 140/90 or greater, please contact your primary care physician to follow up on this.  _______________________________________________________  If you are age 67 or older, your body mass index should be between 23-30. Your Body mass index is 27.38 kg/m. If this is out of the aforementioned range listed, please consider follow up with your Primary Care Provider.  If you are age 67 or younger, your body mass index should be between 19-25. Your Body mass index is 27.38 kg/m. If this is out of the aformentioned range listed, please consider follow up with your Primary Care Provider.   ________________________________________________________  The [facility] providers would like to encourage you to use MYCHART to communicate with providers for non-urgent requests or questions.  Due to long hold times on the telephone, sending your provider a message by [facility] may be a faster and more efficient way to get a response.  Please allow 48 business hours for a response.  Please remember that this is for non-urgent requests.  _______________________________________________________  [facility] is using a team-based approach to care.  Your team is made up of your doctor and two to three APPS. Our APPS (Nurse Practitioners and Physician Assistants) work with your physician to ensure  care continuity for you. They are fully qualified to address your health concerns and develop a treatment plan. They communicate directly with your gastroenterologist to care for you. Seeing the Advanced Practice Practitioners on your physician's team can help you by facilitating care more promptly, often allowing for earlier appointments, access to diagnostic testing, procedures, and other specialty referrals.   Thank you for trusting me with your gastrointestinal care. Deanna May, FNP-C

## 2024-09-16 NOTE — Progress Notes (Signed)
 "  Chief Complaint:chronic constipation Primary GI Doctor:(Dr. Eda) Dr. San  HPI:  Patient is a  69  year old male patient with past medical history of asthma, alpha gal syndrome, and chronic constipation, who was referred to me by Jolinda Norene HERO, DO on 08/17/24 for a evaluation of chronic constipation .    Interval History Patient last seen in GI office on 06/15/21 by Dr. Eda for constipation.  Patient presents today for evaluation of chronic constipation Patient has trialed and failed:  High fiber diet, OTC GasEx, Metamucil tsp po daily, OTC Miralax , Linzess  , lubiprostone  24 mcg BID, OTC fleet enemas. He takes following on day 7 ; 3 stool softeners, 3 ducolax, and otc mag citrate  He has occasional nausea and bloating by day 7. No blood in stool.  No history of abdominal surgeries.   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   01/2021 colonoscopy, recall 7 years - Non- bleeding internal hemorrhoids.  - One 2 mm polyp in the ascending colon, removed with a cold snare. Resected and retrieved.  - One less than 1 mm polyp at the appendiceal orifice, removed piecemeal using a cold biopsy forceps. Resected and retrieved.  - The examination was otherwise normal on direct and retroflexion views. Path:  Diagnosis 1. Surgical [P], appendix, appendiceal orifice, polyp (1)  - TUBULAR ADENOMA (2 OF 3 FRAGMENTS)  - BENIGN COLONIC MUCOSA (1 OF 3 FRAGMENTS)  - NO HIGH-GRADE DYSPLASIA OR MALIGNANCY IDENTIFIED 2. Surgical [P], colon, ascending, polyp (1)  - BENIGN COLONIC MUCOSA (2 OF 2 FRAGMENTS)  - NO HIGH-GRADE DYSPLASIA OR MALIGNANCY IDENTIFIED   Wt Readings from Last 3 Encounters:  09/16/24 185 lb 6 oz (84.1 kg)  07/26/24 185 lb (83.9 kg)   03/19/24 186 lb 6 oz (84.5 kg)   Past Medical History:  Diagnosis Date   Allergy  to alpha-gal    Asthma    Mastoiditis 2004   hospitalized for this. no recurrent issues   Obesity    Past Surgical History:  Procedure Laterality Date   KNEE ARTHROSCOPY Right    TONSILLECTOMY     Current Outpatient Medications  Medication Sig Dispense Refill   albuterol  (VENTOLIN  HFA) 108 (90 Base) MCG/ACT inhaler Inhale 1-2 puffs into the lungs as needed.     EPINEPHrine  (EPIPEN  2-PAK) 0.3 mg/0.3 mL IJ SOAJ injection Inject 0.3 mg into the muscle as needed for anaphylaxis. 2 each 0   Prucalopride Succinate  (MOTEGRITY ) 2 MG TABS Take 1 tablet (2 mg total) by mouth daily. 30 tablet 2   No current facility-administered medications for this visit.   Allergies as of 09/16/2024   (No Known Allergies)   Family History  Problem Relation Age of Onset   Heart disease Mother    Heart attack Mother 105       cause of death   Diabetes Father    Cancer Father        brain tumor   Colon cancer Paternal Uncle    Asthma Paternal Uncle    Cancer Paternal Grandfather        ? type   Suicidality Son    Esophageal cancer Neg Hx    Pancreatic cancer Neg Hx    Stomach cancer Neg Hx  Review of Systems:    Constitutional: No weight loss, fever, chills, weakness or fatigue HEENT: Eyes: No change in vision               Ears, Nose, Throat:  No change in hearing or congestion Skin: No rash or itching Cardiovascular: No chest pain, chest pressure or palpitations   Respiratory: No SOB or cough Gastrointestinal: See HPI and otherwise negative Genitourinary: No dysuria or change in urinary frequency Neurological: No headache, dizziness or syncope Musculoskeletal: No new muscle or joint pain Hematologic: No bleeding or bruising Psychiatric: No history of depression or anxiety    Physical Exam:  Vital signs: BP 106/68   Pulse 66   Ht 5' 9 (1.753 m)   Wt 185 lb 6 oz (84.1 kg)   SpO2 95%   BMI 27.38  kg/m   Constitutional:   Pleasant male appears to be in NAD, Well developed, Well nourished, alert and cooperative Eyes:   PEERL, EOMI. No icterus. Conjunctiva pink. Neck:  Supple Throat: Oral cavity and pharynx without inflammation, swelling or lesion.  Respiratory: Respirations even and unlabored. Lungs clear to auscultation bilaterally.   No wheezes, crackles, or rhonchi.  Cardiovascular: Normal S1, S2. Regular rate and rhythm. No peripheral edema, cyanosis or pallor.  Gastrointestinal:  Soft, nondistended, nontender. No rebound or guarding. Normal bowel sounds. No appreciable masses or hepatomegaly. Rectal:  Not performed.  Msk:  Symmetrical without gross deformities. Without edema, no deformity or joint abnormality.  Neurologic:  Alert and  oriented x4;  grossly normal neurologically.  Skin:   Dry and intact without significant lesions or rashes.  RELEVANT LABS AND IMAGING: CBC    Latest Ref Rng & Units 02/14/2024    8:14 PM 04/03/2023   11:03 AM 03/15/2022   11:52 AM  CBC  WBC 4.0 - 10.5 K/uL 5.4  6.1  5.5   Hemoglobin 13.0 - 17.0 g/dL 83.6  85.1  84.7   Hematocrit 39.0 - 52.0 % 48.5  44.8  44.7   Platelets 150 - 400 K/uL 141  197  189      CMP     Latest Ref Rng & Units 02/14/2024    8:14 PM 04/03/2023   11:03 AM 03/15/2022   11:52 AM  CMP  Glucose 70 - 99 mg/dL 892  82  88   BUN 8 - 23 mg/dL 16  16  18    Creatinine 0.61 - 1.24 mg/dL 8.87  9.11  9.12   Sodium 135 - 145 mmol/L 138  142  141   Potassium 3.5 - 5.1 mmol/L 4.2  4.2  4.4   Chloride 98 - 111 mmol/L 101  102  102   CO2 22 - 32 mmol/L 28  25  24    Calcium 8.9 - 10.3 mg/dL 9.5  9.5  9.2   Total Protein 6.5 - 8.1 g/dL 7.4  6.7  7.1   Total Bilirubin 0.0 - 1.2 mg/dL 0.3  0.5  0.5   Alkaline Phos 38 - 126 U/L 72  64  59   AST 15 - 41 U/L 39  16  14   ALT 0 - 44 U/L 39  10  13      Lab Results  Component Value Date   TSH 1.740 04/03/2023     Assessment: Encounter Diagnoses  Name Primary?   Chronic  idiopathic constipation Yes   Bloating    History of colonic polyps     69 year old male patient who presents  with chronic constipation, reports going 7 or more days without a bowel movement.  Patient has tried and failed high-fiber diet, drinking plenty of fluids, over-the-counter laxatives, stool softeners, Fleet enemas, pro secretory agents such as Amitiza  and high dose Linzess  290 mcg p.o. daily with no improvement in symptoms.  Patient will experience abdominal discomfort with bloating and nausea on day 7.  Will go ahead and order abdominal x-ray two-view to evaluate stool burden.  Last colonoscopy 6/22 with 2 colonic polyps, 1 tubular adenoma.  Suspect his symptoms are most likely related to slow transit constipation, and would benefit from promotility agent such as Motegrity  2mg  po daily. Recommend bowel purge day #1 followed by medication day #2.   Plan: 1. Bowel purge day #1 2. Sent RX for Motegrity  2 mg po daily  3. Abdominal xray 2 view today 4. Continue High fiber diet 5. Drink plenty of fluids 6. Colonoscopy recall , 01/2028  Thank you for the courtesy of this consult. Please call me with any questions or concerns.   Ernesteen Mihalic, FNP-C [facility] 09/16/2024, 2:25 PM  Cc: Jolinda Norene HERO, DO  "

## 2024-09-17 ENCOUNTER — Telehealth: Payer: Self-pay | Admitting: Gastroenterology

## 2024-09-17 ENCOUNTER — Other Ambulatory Visit: Payer: Self-pay | Admitting: Gastroenterology

## 2024-09-17 DIAGNOSIS — K5904 Chronic idiopathic constipation: Secondary | ICD-10-CM

## 2024-09-17 MED ORDER — PRUCALOPRIDE SUCCINATE 2 MG PO TABS: 2.0000 mg | ORAL_TABLET | Freq: Every day | ORAL | 2 refills | Status: AC

## 2024-09-17 NOTE — Telephone Encounter (Signed)
 Inbound call from patient stating he was advised to call office if medication Motegrity  was too expensive, patient states medication is expensive and he would like it sent to [facility] pharmacy  Please advise  Thank you

## 2024-09-17 NOTE — Progress Notes (Signed)
 Script sent to [facility] per request

## 2024-09-21 ENCOUNTER — Ambulatory Visit: Payer: Self-pay | Admitting: Gastroenterology
